# Patient Record
Sex: Female | Born: 1960 | Race: Black or African American | Hispanic: No | Smoking: Former smoker
Health system: Southern US, Community
[De-identification: ages and names within clinical notes are randomized; demographics above are authoritative.]

## PROBLEM LIST (undated history)

## (undated) DIAGNOSIS — R519 Headache, unspecified: Secondary | ICD-10-CM

## (undated) DIAGNOSIS — K219 Gastro-esophageal reflux disease without esophagitis: Secondary | ICD-10-CM

## (undated) DIAGNOSIS — M1990 Unspecified osteoarthritis, unspecified site: Secondary | ICD-10-CM

## (undated) DIAGNOSIS — M549 Dorsalgia, unspecified: Secondary | ICD-10-CM

## (undated) DIAGNOSIS — G5600 Carpal tunnel syndrome, unspecified upper limb: Secondary | ICD-10-CM

## (undated) DIAGNOSIS — Z87442 Personal history of urinary calculi: Secondary | ICD-10-CM

## (undated) DIAGNOSIS — I823 Embolism and thrombosis of renal vein: Principal | ICD-10-CM

## (undated) DIAGNOSIS — J189 Pneumonia, unspecified organism: Secondary | ICD-10-CM

## (undated) DIAGNOSIS — C801 Malignant (primary) neoplasm, unspecified: Secondary | ICD-10-CM

## (undated) DIAGNOSIS — R0600 Dyspnea, unspecified: Secondary | ICD-10-CM

## (undated) DIAGNOSIS — G8929 Other chronic pain: Secondary | ICD-10-CM

## (undated) DIAGNOSIS — E7800 Pure hypercholesterolemia, unspecified: Secondary | ICD-10-CM

## (undated) DIAGNOSIS — I10 Essential (primary) hypertension: Secondary | ICD-10-CM

## (undated) DIAGNOSIS — M79606 Pain in leg, unspecified: Secondary | ICD-10-CM

## (undated) HISTORY — PX: CHOLECYSTECTOMY: SHX55

## (undated) HISTORY — DX: Dorsalgia, unspecified: M54.9

## (undated) HISTORY — PX: OTHER SURGICAL HISTORY: SHX169

## (undated) HISTORY — PX: BACK SURGERY: SHX140

## (undated) HISTORY — PX: APPENDECTOMY: SHX54

## (undated) HISTORY — DX: Embolism and thrombosis of renal vein: I82.3

## (undated) HISTORY — DX: Pain in leg, unspecified: M79.606

## (undated) HISTORY — PX: ABDOMINAL SURGERY: SHX537

## (undated) HISTORY — PX: ABDOMINAL HYSTERECTOMY: SHX81

---

## 2002-01-21 ENCOUNTER — Emergency Department (HOSPITAL_COMMUNITY): Admission: EM | Admit: 2002-01-21 | Discharge: 2002-01-22 | Payer: Self-pay | Admitting: Emergency Medicine

## 2002-02-22 ENCOUNTER — Emergency Department (HOSPITAL_COMMUNITY): Admission: EM | Admit: 2002-02-22 | Discharge: 2002-02-22 | Payer: Self-pay | Admitting: Emergency Medicine

## 2002-03-30 ENCOUNTER — Emergency Department (HOSPITAL_COMMUNITY): Admission: EM | Admit: 2002-03-30 | Discharge: 2002-03-30 | Payer: Self-pay | Admitting: Emergency Medicine

## 2002-03-30 ENCOUNTER — Encounter: Payer: Self-pay | Admitting: Emergency Medicine

## 2002-03-31 ENCOUNTER — Emergency Department (HOSPITAL_COMMUNITY): Admission: EM | Admit: 2002-03-31 | Discharge: 2002-03-31 | Payer: Self-pay | Admitting: Emergency Medicine

## 2002-04-04 ENCOUNTER — Emergency Department (HOSPITAL_COMMUNITY): Admission: EM | Admit: 2002-04-04 | Discharge: 2002-04-04 | Payer: Self-pay | Admitting: Emergency Medicine

## 2002-04-19 ENCOUNTER — Emergency Department (HOSPITAL_COMMUNITY): Admission: EM | Admit: 2002-04-19 | Discharge: 2002-04-19 | Payer: Self-pay | Admitting: Emergency Medicine

## 2002-04-20 ENCOUNTER — Emergency Department (HOSPITAL_COMMUNITY): Admission: EM | Admit: 2002-04-20 | Discharge: 2002-04-20 | Payer: Self-pay | Admitting: Emergency Medicine

## 2002-04-22 ENCOUNTER — Emergency Department (HOSPITAL_COMMUNITY): Admission: EM | Admit: 2002-04-22 | Discharge: 2002-04-22 | Payer: Self-pay | Admitting: *Deleted

## 2002-05-01 ENCOUNTER — Encounter: Payer: Self-pay | Admitting: Emergency Medicine

## 2002-05-01 ENCOUNTER — Emergency Department (HOSPITAL_COMMUNITY): Admission: EM | Admit: 2002-05-01 | Discharge: 2002-05-01 | Payer: Self-pay | Admitting: Emergency Medicine

## 2002-05-10 ENCOUNTER — Encounter: Payer: Self-pay | Admitting: *Deleted

## 2002-05-10 ENCOUNTER — Emergency Department (HOSPITAL_COMMUNITY): Admission: EM | Admit: 2002-05-10 | Discharge: 2002-05-10 | Payer: Self-pay | Admitting: Emergency Medicine

## 2002-05-19 ENCOUNTER — Encounter: Payer: Self-pay | Admitting: Emergency Medicine

## 2002-05-19 ENCOUNTER — Emergency Department (HOSPITAL_COMMUNITY): Admission: EM | Admit: 2002-05-19 | Discharge: 2002-05-19 | Payer: Self-pay | Admitting: Emergency Medicine

## 2002-05-21 ENCOUNTER — Emergency Department (HOSPITAL_COMMUNITY): Admission: EM | Admit: 2002-05-21 | Discharge: 2002-05-21 | Payer: Self-pay | Admitting: *Deleted

## 2002-05-22 ENCOUNTER — Emergency Department (HOSPITAL_COMMUNITY): Admission: EM | Admit: 2002-05-22 | Discharge: 2002-05-22 | Payer: Self-pay | Admitting: Emergency Medicine

## 2002-05-24 ENCOUNTER — Emergency Department (HOSPITAL_COMMUNITY): Admission: EM | Admit: 2002-05-24 | Discharge: 2002-05-24 | Payer: Self-pay | Admitting: Emergency Medicine

## 2002-05-24 ENCOUNTER — Encounter: Payer: Self-pay | Admitting: Emergency Medicine

## 2002-05-28 ENCOUNTER — Encounter: Payer: Self-pay | Admitting: Emergency Medicine

## 2002-05-28 ENCOUNTER — Emergency Department (HOSPITAL_COMMUNITY): Admission: EM | Admit: 2002-05-28 | Discharge: 2002-05-28 | Payer: Self-pay | Admitting: Emergency Medicine

## 2002-05-31 ENCOUNTER — Encounter: Payer: Self-pay | Admitting: Emergency Medicine

## 2002-05-31 ENCOUNTER — Emergency Department (HOSPITAL_COMMUNITY): Admission: EM | Admit: 2002-05-31 | Discharge: 2002-06-01 | Payer: Self-pay | Admitting: Emergency Medicine

## 2002-06-10 ENCOUNTER — Encounter: Payer: Self-pay | Admitting: Emergency Medicine

## 2002-06-10 ENCOUNTER — Emergency Department (HOSPITAL_COMMUNITY): Admission: EM | Admit: 2002-06-10 | Discharge: 2002-06-10 | Payer: Self-pay | Admitting: Emergency Medicine

## 2002-06-22 ENCOUNTER — Encounter: Payer: Self-pay | Admitting: Emergency Medicine

## 2002-06-22 ENCOUNTER — Emergency Department (HOSPITAL_COMMUNITY): Admission: EM | Admit: 2002-06-22 | Discharge: 2002-06-22 | Payer: Self-pay | Admitting: Emergency Medicine

## 2002-06-25 ENCOUNTER — Emergency Department (HOSPITAL_COMMUNITY): Admission: EM | Admit: 2002-06-25 | Discharge: 2002-06-26 | Payer: Self-pay | Admitting: Emergency Medicine

## 2002-07-03 ENCOUNTER — Emergency Department (HOSPITAL_COMMUNITY): Admission: EM | Admit: 2002-07-03 | Discharge: 2002-07-03 | Payer: Self-pay | Admitting: Emergency Medicine

## 2002-07-03 ENCOUNTER — Encounter: Payer: Self-pay | Admitting: Emergency Medicine

## 2002-07-08 ENCOUNTER — Encounter: Payer: Self-pay | Admitting: Emergency Medicine

## 2002-07-08 ENCOUNTER — Emergency Department (HOSPITAL_COMMUNITY): Admission: AD | Admit: 2002-07-08 | Discharge: 2002-07-08 | Payer: Self-pay | Admitting: Emergency Medicine

## 2002-07-14 ENCOUNTER — Emergency Department (HOSPITAL_COMMUNITY): Admission: AD | Admit: 2002-07-14 | Discharge: 2002-07-14 | Payer: Self-pay

## 2002-07-15 ENCOUNTER — Emergency Department (HOSPITAL_COMMUNITY): Admission: EM | Admit: 2002-07-15 | Discharge: 2002-07-15 | Payer: Self-pay | Admitting: Emergency Medicine

## 2002-07-16 ENCOUNTER — Emergency Department (HOSPITAL_COMMUNITY): Admission: EM | Admit: 2002-07-16 | Discharge: 2002-07-16 | Payer: Self-pay | Admitting: Emergency Medicine

## 2002-07-17 ENCOUNTER — Emergency Department (HOSPITAL_COMMUNITY): Admission: AD | Admit: 2002-07-17 | Discharge: 2002-07-17 | Payer: Self-pay | Admitting: Emergency Medicine

## 2002-07-18 ENCOUNTER — Emergency Department (HOSPITAL_COMMUNITY): Admission: EM | Admit: 2002-07-18 | Discharge: 2002-07-18 | Payer: Self-pay | Admitting: *Deleted

## 2002-07-30 ENCOUNTER — Emergency Department (HOSPITAL_COMMUNITY): Admission: EM | Admit: 2002-07-30 | Discharge: 2002-07-31 | Payer: Self-pay | Admitting: Emergency Medicine

## 2002-07-31 ENCOUNTER — Encounter: Payer: Self-pay | Admitting: Emergency Medicine

## 2002-08-05 ENCOUNTER — Encounter: Payer: Self-pay | Admitting: Emergency Medicine

## 2002-08-05 ENCOUNTER — Emergency Department (HOSPITAL_COMMUNITY): Admission: EM | Admit: 2002-08-05 | Discharge: 2002-08-06 | Payer: Self-pay | Admitting: Emergency Medicine

## 2002-08-09 ENCOUNTER — Emergency Department (HOSPITAL_COMMUNITY): Admission: EM | Admit: 2002-08-09 | Discharge: 2002-08-09 | Payer: Self-pay | Admitting: Emergency Medicine

## 2002-08-14 ENCOUNTER — Emergency Department (HOSPITAL_COMMUNITY): Admission: EM | Admit: 2002-08-14 | Discharge: 2002-08-14 | Payer: Self-pay | Admitting: Emergency Medicine

## 2002-08-21 ENCOUNTER — Emergency Department (HOSPITAL_COMMUNITY): Admission: EM | Admit: 2002-08-21 | Discharge: 2002-08-21 | Payer: Self-pay | Admitting: Internal Medicine

## 2002-08-26 ENCOUNTER — Emergency Department (HOSPITAL_COMMUNITY): Admission: EM | Admit: 2002-08-26 | Discharge: 2002-08-27 | Payer: Self-pay

## 2002-09-04 ENCOUNTER — Encounter: Payer: Self-pay | Admitting: Emergency Medicine

## 2002-09-04 ENCOUNTER — Emergency Department (HOSPITAL_COMMUNITY): Admission: EM | Admit: 2002-09-04 | Discharge: 2002-09-05 | Payer: Self-pay | Admitting: Emergency Medicine

## 2003-06-04 ENCOUNTER — Ambulatory Visit (HOSPITAL_COMMUNITY): Admission: RE | Admit: 2003-06-04 | Discharge: 2003-06-04 | Payer: Self-pay | Admitting: General Surgery

## 2003-09-19 ENCOUNTER — Emergency Department (HOSPITAL_COMMUNITY): Admission: EM | Admit: 2003-09-19 | Discharge: 2003-09-19 | Payer: Self-pay | Admitting: Emergency Medicine

## 2003-11-17 ENCOUNTER — Emergency Department (HOSPITAL_COMMUNITY): Admission: EM | Admit: 2003-11-17 | Discharge: 2003-11-17 | Payer: Self-pay | Admitting: Emergency Medicine

## 2003-12-22 ENCOUNTER — Emergency Department (HOSPITAL_COMMUNITY): Admission: EM | Admit: 2003-12-22 | Discharge: 2003-12-22 | Payer: Self-pay | Admitting: Emergency Medicine

## 2004-01-26 ENCOUNTER — Emergency Department (HOSPITAL_COMMUNITY): Admission: EM | Admit: 2004-01-26 | Discharge: 2004-01-26 | Payer: Self-pay | Admitting: Emergency Medicine

## 2004-01-28 ENCOUNTER — Emergency Department (HOSPITAL_COMMUNITY): Admission: EM | Admit: 2004-01-28 | Discharge: 2004-01-28 | Payer: Self-pay | Admitting: Emergency Medicine

## 2004-03-16 ENCOUNTER — Emergency Department (HOSPITAL_COMMUNITY): Admission: EM | Admit: 2004-03-16 | Discharge: 2004-03-16 | Payer: Self-pay | Admitting: Emergency Medicine

## 2011-07-21 ENCOUNTER — Emergency Department (HOSPITAL_COMMUNITY)
Admission: EM | Admit: 2011-07-21 | Discharge: 2011-07-21 | Disposition: A | Discharge status: Home / Self Care | Payer: Medicare Other | Attending: Emergency Medicine | Admitting: Emergency Medicine

## 2011-07-21 ENCOUNTER — Encounter (HOSPITAL_COMMUNITY): Payer: Self-pay | Admitting: *Deleted

## 2011-07-21 DIAGNOSIS — M79609 Pain in unspecified limb: Secondary | ICD-10-CM | POA: Insufficient documentation

## 2011-07-21 DIAGNOSIS — Z79899 Other long term (current) drug therapy: Secondary | ICD-10-CM | POA: Insufficient documentation

## 2011-07-21 DIAGNOSIS — Z859 Personal history of malignant neoplasm, unspecified: Secondary | ICD-10-CM | POA: Insufficient documentation

## 2011-07-21 DIAGNOSIS — I10 Essential (primary) hypertension: Secondary | ICD-10-CM | POA: Insufficient documentation

## 2011-07-21 DIAGNOSIS — E7800 Pure hypercholesterolemia, unspecified: Secondary | ICD-10-CM | POA: Insufficient documentation

## 2011-07-21 DIAGNOSIS — Y92009 Unspecified place in unspecified non-institutional (private) residence as the place of occurrence of the external cause: Secondary | ICD-10-CM | POA: Insufficient documentation

## 2011-07-21 DIAGNOSIS — IMO0002 Reserved for concepts with insufficient information to code with codable children: Secondary | ICD-10-CM | POA: Insufficient documentation

## 2011-07-21 HISTORY — DX: Malignant (primary) neoplasm, unspecified: C80.1

## 2011-07-21 HISTORY — DX: Essential (primary) hypertension: I10

## 2011-07-21 HISTORY — DX: Pure hypercholesterolemia, unspecified: E78.00

## 2011-07-21 MED ORDER — SILVER SULFADIAZINE 1 % EX CREA
TOPICAL_CREAM | Freq: Once | CUTANEOUS | Status: AC
  Administered 2011-07-21: 21:00:00 via TOPICAL
  Filled 2011-07-21: qty 50

## 2011-07-21 MED ORDER — HYDROCODONE-ACETAMINOPHEN 5-325 MG PO TABS
1.0000 | ORAL_TABLET | Freq: Once | ORAL | Status: AC
  Administered 2011-07-21: 1 via ORAL
  Filled 2011-07-21: qty 1

## 2011-07-21 MED ORDER — HYDROCODONE-ACETAMINOPHEN 5-325 MG PO TABS: 1.0000 | ORAL_TABLET | Freq: Four times a day (QID) | ORAL | Status: DC | PRN

## 2011-07-21 MED ORDER — HYDROCODONE-ACETAMINOPHEN 5-325 MG PO TABS
1.0000 | ORAL_TABLET | Freq: Once | ORAL | Status: AC
  Administered 2011-07-21: 1 via ORAL

## 2011-07-21 NOTE — ED Provider Notes (Signed)
Medical screening examination/treatment/procedure(s) were performed by non-physician practitioner and as supervising physician I was immediately available for consultation/collaboration.   Benny Lennert, MD 07/21/11 2253
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
## 2011-07-21 NOTE — ED Notes (Signed)
Patient with no complaints at this time. Respirations even and unlabored. Skin warm/dry. Discharge instructions reviewed with patient at this time. Patient given opportunity to voice concerns/ask questions. Patient discharged at this time and left Emergency Department with steady gait.   
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
## 2011-07-29 ENCOUNTER — Emergency Department (HOSPITAL_COMMUNITY)
Admission: EM | Admit: 2011-07-29 | Discharge: 2011-07-29 | Disposition: A | Discharge status: Home / Self Care | Payer: Medicare Other | Attending: Emergency Medicine | Admitting: Emergency Medicine

## 2011-07-29 ENCOUNTER — Encounter (HOSPITAL_COMMUNITY): Payer: Self-pay | Admitting: *Deleted

## 2011-07-29 DIAGNOSIS — M549 Dorsalgia, unspecified: Secondary | ICD-10-CM

## 2011-07-29 DIAGNOSIS — T23011A Burn of unspecified degree of right thumb (nail), initial encounter: Secondary | ICD-10-CM

## 2011-07-29 DIAGNOSIS — I10 Essential (primary) hypertension: Secondary | ICD-10-CM | POA: Insufficient documentation

## 2011-07-29 DIAGNOSIS — M5430 Sciatica, unspecified side: Secondary | ICD-10-CM | POA: Insufficient documentation

## 2011-07-29 DIAGNOSIS — E7800 Pure hypercholesterolemia, unspecified: Secondary | ICD-10-CM | POA: Insufficient documentation

## 2011-07-29 DIAGNOSIS — M79609 Pain in unspecified limb: Secondary | ICD-10-CM | POA: Insufficient documentation

## 2011-07-29 MED ORDER — NAPROXEN 500 MG PO TABS: 500.0000 mg | ORAL_TABLET | Freq: Two times a day (BID) | ORAL | Status: AC

## 2011-07-29 MED ORDER — ORPHENADRINE CITRATE ER 100 MG PO TB12: 100.0000 mg | ORAL_TABLET | Freq: Two times a day (BID) | ORAL | Status: AC

## 2011-07-29 MED ORDER — HYDROCODONE-ACETAMINOPHEN 10-325 MG PO TABS: 1.0000 | ORAL_TABLET | Freq: Four times a day (QID) | ORAL | Status: AC | PRN
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
## 2011-10-07 ENCOUNTER — Emergency Department (HOSPITAL_COMMUNITY)
Admission: EM | Admit: 2011-10-07 | Discharge: 2011-10-07 | Disposition: A | Discharge status: Home / Self Care | Payer: Medicare Other | Attending: Emergency Medicine | Admitting: Emergency Medicine

## 2011-10-07 ENCOUNTER — Encounter (HOSPITAL_COMMUNITY): Payer: Self-pay | Admitting: Emergency Medicine

## 2011-10-07 DIAGNOSIS — M549 Dorsalgia, unspecified: Secondary | ICD-10-CM

## 2011-10-07 DIAGNOSIS — I10 Essential (primary) hypertension: Secondary | ICD-10-CM | POA: Insufficient documentation

## 2011-10-07 DIAGNOSIS — Z859 Personal history of malignant neoplasm, unspecified: Secondary | ICD-10-CM | POA: Insufficient documentation

## 2011-10-07 DIAGNOSIS — E7800 Pure hypercholesterolemia, unspecified: Secondary | ICD-10-CM | POA: Insufficient documentation

## 2011-10-07 DIAGNOSIS — M545 Low back pain, unspecified: Secondary | ICD-10-CM | POA: Insufficient documentation

## 2011-10-07 MED ORDER — HYDROCODONE-ACETAMINOPHEN 5-325 MG PO TABS: 1.0000 | ORAL_TABLET | ORAL | Status: AC | PRN

## 2011-10-07 NOTE — ED Provider Notes (Signed)
Medical screening examination/treatment/procedure(s) were performed by non-physician practitioner and as supervising physician I was immediately available for consultation/collaboration.   Joya Gaskins, MD 10/07/11 1534

## 2011-10-07 NOTE — ED Notes (Signed)
Patient with no complaints at this time. Respirations even and unlabored. Skin warm/dry. Discharge instructions reviewed with patient at this time. Patient given opportunity to voice concerns/ask questions. IV removed per policy and band-aid applied to sit. Patient discharged at this time and left Emergency Department with steady gait.   

## 2011-10-07 NOTE — ED Notes (Signed)
Chronic lower back pain and sciatica. Pt c/o lower back pain on right side x 1 week. Denies urinary problems/changes. Radiates down both legs at this time. Has been taking tylenol without relief.

## 2011-10-07 NOTE — ED Provider Notes (Signed)
History     CSN: [id number]  Arrival date & time 10/07/11  1230   First MD Initiated Contact with Patient 10/07/11 1251      Chief Complaint  Patient presents with  . Back Pain    HPI Katrina Vasquez is a 51 y.o. female who presents to the ED with back pain. The pain started about a week ago. The pain is located in the lower back on the right side and goes down the right leg. She rates the pain as 8/10. Associated symptoms include headache. Hx of chronic back pain but usually not this bad. Has had back surgery. She denies loss of control of bladder or bowels. She has taken ibuprofen and tylenol without relief. The pain started after helping move and take care of sick mother. The history was provided by the patient.  Past Medical History  Diagnosis Date  . Hypertension   . Hypercholesteremia   . Cancer     Past Surgical History  Procedure Date  . Abdominal surgery   . Abdominal hysterectomy     History reviewed. No pertinent family history.  History  Substance Use Topics  . Smoking status: Never Smoker   . Smokeless tobacco: Not on file  . Alcohol Use: No    OB History    Grav Para Term Preterm Abortions TAB SAB Ect Mult Living                  Review of Systems  Constitutional: Negative for fever, chills, diaphoresis and fatigue.  HENT: Negative for ear pain, congestion, sore throat, facial swelling, neck pain, neck stiffness, dental problem and sinus pressure.   Eyes: Negative for photophobia, pain and discharge.  Respiratory: Negative for cough, chest tightness and wheezing.   Cardiovascular: Negative for chest pain and leg swelling.  Gastrointestinal: Negative for nausea, vomiting, abdominal pain, diarrhea, constipation and abdominal distention.  Genitourinary: Negative for dysuria, frequency, flank pain and difficulty urinating.  Musculoskeletal: Positive for back pain. Negative for myalgias and gait problem.  Skin: Negative for color change and rash.    Neurological: Positive for headaches. Negative for dizziness, speech difficulty, weakness, light-headedness and numbness.  Psychiatric/Behavioral: Negative for confusion and agitation. The patient is not nervous/anxious.        Taking medication for depression    Allergies  Percocet; Tramadol; and Vistaril  Home Medications   Current Outpatient Rx  Name Route Sig Dispense Refill  . BACLOFEN 10 MG PO TABS Oral Take 10 mg by mouth 2 (two) times daily.    . CEPHALEXIN 500 MG PO CAPS Oral Take 500 mg by mouth every 6 (six) hours.    . ESTRADIOL 0.5 MG PO TABS Oral Take 1 mg by mouth daily.    Marland Kitchen GABAPENTIN 300 MG PO CAPS Oral Take 300 mg by mouth 3 (three) times daily.    Marland Kitchen LISINOPRIL 20 MG PO TABS Oral Take 20 mg by mouth daily.    Marland Kitchen NAPROXEN 500 MG PO TABS Oral Take 1 tablet (500 mg total) by mouth 2 (two) times daily. 30 tablet 0  . OMEPRAZOLE 20 MG PO CPDR Oral Take 20 mg by mouth 2 (two) times daily.     Marland Kitchen PROMETHAZINE HCL 25 MG PO TABS Oral Take 25 mg by mouth 2 (two) times daily.    . QUETIAPINE FUMARATE 100 MG PO TABS Oral Take 100 mg by mouth at bedtime.      BP 139/92  Pulse 81  Temp 98.5 F (  36.9 C) (Oral)  Resp 20  Ht 5\' 3"  (1.6 m)  Wt 120 lb (54.432 kg)  BMI 21.26 kg/m2  SpO2 100%  Physical Exam  Nursing note and vitals reviewed. Constitutional: She is oriented to person, place, and time. She appears well-developed and well-nourished. No distress.  HENT:  Head: Normocephalic and atraumatic.  Eyes: EOM are normal. Pupils are equal, round, and reactive to light.  Neck: Neck supple.  Cardiovascular: Normal rate and regular rhythm.   Pulmonary/Chest: Effort normal. No respiratory distress. She has no wheezes.  Abdominal: Soft. There is no tenderness.  Musculoskeletal: Normal range of motion. She exhibits no edema.       Pain in right lower lumbar area with range of motion and palpation. Pedal pulses present and equal bilateral. Ambulatory with steady gait.    Neurological: She is alert and oriented to person, place, and time. She has normal strength and normal reflexes. No cranial nerve deficit or sensory deficit.       Straight leg raises without difficulty.  Skin: Skin is warm and dry.  Psychiatric: She has a normal mood and affect. Her behavior is normal. Judgment and thought content normal.   Assessment: 51 y.o. female with low back pain  Plan:  Pain management   Follow up with neuro  Discussed with the patient and all questioned fully answered. She will follow up with with neuro or return here if any problems arise. Follow-up Information    Schedule an appointment as soon as possible for a visit with [facility], Darleen Crocker, MD.   Contact information:   [address] [place] [address] [phone]              Medication List     As of 10/07/2011  2:06 PM    START taking these medications         HYDROcodone-acetaminophen 5-325 MG per tablet   Commonly known as: NORCO/VICODIN   Take 1 tablet by mouth every 4 (four) hours as needed for pain.      ASK your doctor about these medications         baclofen 10 MG tablet   Commonly known as: LIORESAL      cephALEXin 500 MG capsule   Commonly known as: KEFLEX      estradiol 0.5 MG tablet   Commonly known as: ESTRACE      gabapentin 300 MG capsule   Commonly known as: NEURONTIN      lisinopril 20 MG tablet   Commonly known as: PRINIVIL,ZESTRIL      naproxen 500 MG tablet   Commonly known as: NAPROSYN   Take 1 tablet (500 mg total) by mouth 2 (two) times daily.      omeprazole 20 MG capsule   Commonly known as: PRILOSEC      promethazine 25 MG tablet   Commonly known as: PHENERGAN      QUEtiapine 100 MG tablet   Commonly known as: SEROQUEL          Where to get your medications    These are the prescriptions that you need to pick up.   You may get these medications from any pharmacy.         HYDROcodone-acetaminophen 5-325 MG per tablet            ED Course  Procedures        [facility], NP 10/07/11 1407

## 2011-10-17 ENCOUNTER — Other Ambulatory Visit: Payer: Self-pay | Admitting: Obstetrics & Gynecology

## 2011-10-17 DIAGNOSIS — Z1231 Encounter for screening mammogram for malignant neoplasm of breast: Secondary | ICD-10-CM

## 2011-10-27 ENCOUNTER — Ambulatory Visit (HOSPITAL_COMMUNITY): Payer: Medicare Other

## 2011-11-02 ENCOUNTER — Ambulatory Visit (HOSPITAL_COMMUNITY)
Admission: RE | Admit: 2011-11-02 | Discharge: 2011-11-02 | Disposition: A | Discharge status: Home / Self Care | Payer: Medicare Other | Source: Ambulatory Visit | Attending: Obstetrics & Gynecology | Admitting: Obstetrics & Gynecology

## 2011-11-02 DIAGNOSIS — Z1231 Encounter for screening mammogram for malignant neoplasm of breast: Secondary | ICD-10-CM | POA: Insufficient documentation

## 2011-11-07 ENCOUNTER — Other Ambulatory Visit: Payer: Self-pay | Admitting: Obstetrics & Gynecology

## 2011-11-07 DIAGNOSIS — R928 Other abnormal and inconclusive findings on diagnostic imaging of breast: Secondary | ICD-10-CM

## 2011-11-10 ENCOUNTER — Ambulatory Visit
Admission: RE | Admit: 2011-11-10 | Discharge: 2011-11-10 | Disposition: A | Discharge status: Home / Self Care | Payer: Medicare Other | Source: Ambulatory Visit | Attending: Obstetrics & Gynecology | Admitting: Obstetrics & Gynecology

## 2011-11-10 ENCOUNTER — Other Ambulatory Visit: Payer: Self-pay | Admitting: Obstetrics & Gynecology

## 2011-11-10 DIAGNOSIS — R928 Other abnormal and inconclusive findings on diagnostic imaging of breast: Secondary | ICD-10-CM

## 2012-10-25 ENCOUNTER — Emergency Department (HOSPITAL_COMMUNITY): Payer: Medicare Other

## 2012-10-25 ENCOUNTER — Emergency Department (HOSPITAL_COMMUNITY)
Admission: EM | Admit: 2012-10-25 | Discharge: 2012-10-26 | Disposition: A | Discharge status: Home / Self Care | Payer: Medicare Other | Attending: Emergency Medicine | Admitting: Emergency Medicine

## 2012-10-25 ENCOUNTER — Encounter (HOSPITAL_COMMUNITY): Payer: Self-pay | Admitting: *Deleted

## 2012-10-25 DIAGNOSIS — R112 Nausea with vomiting, unspecified: Secondary | ICD-10-CM | POA: Insufficient documentation

## 2012-10-25 DIAGNOSIS — G8929 Other chronic pain: Secondary | ICD-10-CM | POA: Insufficient documentation

## 2012-10-25 DIAGNOSIS — R109 Unspecified abdominal pain: Secondary | ICD-10-CM | POA: Insufficient documentation

## 2012-10-25 DIAGNOSIS — Z859 Personal history of malignant neoplasm, unspecified: Secondary | ICD-10-CM | POA: Insufficient documentation

## 2012-10-25 DIAGNOSIS — Z8639 Personal history of other endocrine, nutritional and metabolic disease: Secondary | ICD-10-CM | POA: Insufficient documentation

## 2012-10-25 DIAGNOSIS — Z79899 Other long term (current) drug therapy: Secondary | ICD-10-CM | POA: Insufficient documentation

## 2012-10-25 DIAGNOSIS — I10 Essential (primary) hypertension: Secondary | ICD-10-CM | POA: Insufficient documentation

## 2012-10-25 DIAGNOSIS — Z862 Personal history of diseases of the blood and blood-forming organs and certain disorders involving the immune mechanism: Secondary | ICD-10-CM | POA: Insufficient documentation

## 2012-10-25 LAB — URINALYSIS, ROUTINE W REFLEX MICROSCOPIC
Ketones, ur: 15 mg/dL — AB
Nitrite: NEGATIVE
Protein, ur: NEGATIVE mg/dL
Specific Gravity, Urine: 1.031 — ABNORMAL HIGH (ref 1.005–1.030)
Urobilinogen, UA: 0.2 mg/dL (ref 0.0–1.0)
pH: 5.5 (ref 5.0–8.0)

## 2012-10-25 LAB — CBC WITH DIFFERENTIAL/PLATELET
Basophils Absolute: 0 10*3/uL (ref 0.0–0.1)
Basophils Relative: 0 % (ref 0–1)
Eosinophils Relative: 1 % (ref 0–5)
HCT: 38.8 % (ref 36.0–46.0)
MCH: 31.6 pg (ref 26.0–34.0)
MCHC: 34.5 g/dL (ref 30.0–36.0)
MCV: 91.5 fL (ref 78.0–100.0)
Monocytes Absolute: 0.6 10*3/uL (ref 0.1–1.0)
Neutro Abs: 4.2 10*3/uL (ref 1.7–7.7)
Platelets: 251 10*3/uL (ref 150–400)
RBC: 4.24 MIL/uL (ref 3.87–5.11)
RDW: 12.8 % (ref 11.5–15.5)

## 2012-10-25 LAB — TROPONIN I: Troponin I: <0.3 ng/mL (ref <0.30)

## 2012-10-25 LAB — HEPATIC FUNCTION PANEL
AST: 23 U/L (ref 0–37)
Bilirubin, Direct: <0.1 mg/dL (ref 0.0–0.3)
Total Bilirubin: 0.3 mg/dL (ref 0.3–1.2)
Total Protein: 7.8 g/dL (ref 6.0–8.3)

## 2012-10-25 LAB — BASIC METABOLIC PANEL
BUN: 14 mg/dL (ref 6–23)
Calcium: 10 mg/dL (ref 8.4–10.5)
Chloride: 99 mEq/L (ref 96–112)
Creatinine, Ser: 0.83 mg/dL (ref 0.50–1.10)
GFR calc Af Amer: >90 mL/min (ref >90)
GFR calc non Af Amer: 80 mL/min — ABNORMAL LOW (ref >90)

## 2012-10-25 LAB — LIPASE, BLOOD: Lipase: 15 U/L (ref 11–59)

## 2012-10-25 MED ORDER — SODIUM CHLORIDE 0.9 % IV BOLUS (SEPSIS)
1000.0000 mL | Freq: Once | INTRAVENOUS | Status: AC
  Administered 2012-10-25: 1000 mL via INTRAVENOUS

## 2012-10-25 MED ORDER — ONDANSETRON HCL 8 MG PO TABS: 8.0000 mg | ORAL_TABLET | Freq: Three times a day (TID) | ORAL | Status: DC | PRN

## 2012-10-25 MED ORDER — ONDANSETRON 4 MG PO TBDP
8.0000 mg | ORAL_TABLET | Freq: Once | ORAL | Status: AC
  Administered 2012-10-25: 8 mg via ORAL
  Filled 2012-10-25: qty 2

## 2012-10-25 NOTE — ED Notes (Signed)
nv for 3-4 days no diarrhea.  lmp none hyst

## 2012-10-25 NOTE — ED Notes (Signed)
Patient transported to X-ray 

## 2012-10-25 NOTE — ED Notes (Signed)
Lab called; blood tube clotted.  Redrawn, sent via tube system to lab. Pt tolerated well.

## 2012-10-25 NOTE — ED Provider Notes (Signed)
CSN: [id number]     Arrival date & time 10/25/12  1854 History   First MD Initiated Contact with Patient 10/25/12 1925     Chief Complaint  Patient presents with  . Emesis   (Consider location/radiation/quality/duration/timing/severity/associated sxs/prior Treatment) HPI Complains of vomiting 3 or 4 times daily for the past 3 days. She is presently hungry. Denies nausea since treatment with Zofran prior to my exam in the emergency department. He complains of diffuse abdominal pain which is mild and chronic and unchanged from baseline. Last bowel movement 2 days ago. No urinary symptoms. No fever. No other complaint. Nothing makes symptoms better or worse. No other associated symptoms. Past Medical History  Diagnosis Date  . Hypertension   . Hypercholesteremia   . Cancer    Past Surgical History  Procedure Laterality Date  . Abdominal surgery    . Abdominal hysterectomy     No family history on file. History  Substance Use Topics  . Smoking status: Never Smoker   . Smokeless tobacco: Not on file  . Alcohol Use: No   OB History   Grav Para Term Preterm Abortions TAB SAB Ect Mult Living                 Review of Systems  Constitutional: Negative.   HENT: Negative.   Respiratory: Negative.   Cardiovascular: Negative.   Gastrointestinal: Positive for nausea, vomiting and abdominal pain.       Chronic abdominal pain. [facility] pain clinic.  Musculoskeletal: Negative.   Skin: Negative.   Neurological: Negative.   Psychiatric/Behavioral: Negative.   All other systems reviewed and are negative.    Allergies  Percocet; Tramadol; and Vistaril  Home Medications   Current Outpatient Rx  Name  Route  Sig  Dispense  Refill  . baclofen (LIORESAL) 10 MG tablet   Oral   Take 10 mg by mouth 2 (two) times daily.         . cephALEXin (KEFLEX) 500 MG capsule   Oral   Take 500 mg by mouth every 6 (six) hours.         Marland Kitchen estradiol (ESTRACE) 0.5 MG tablet   Oral   Take 1 mg  by mouth daily.         Marland Kitchen gabapentin (NEURONTIN) 300 MG capsule   Oral   Take 300 mg by mouth 3 (three) times daily.         Marland Kitchen lisinopril (PRINIVIL,ZESTRIL) 20 MG tablet   Oral   Take 20 mg by mouth daily.         Marland Kitchen omeprazole (PRILOSEC) 20 MG capsule   Oral   Take 20 mg by mouth 2 (two) times daily.          . promethazine (PHENERGAN) 25 MG tablet   Oral   Take 25 mg by mouth 2 (two) times daily.         . QUEtiapine (SEROQUEL) 100 MG tablet   Oral   Take 100 mg by mouth at bedtime.          BP 129/89  Pulse 114  Temp(Src) 99.8 F (37.7 C) (Oral)  Resp 18  Ht 5\' 4"  (1.626 m)  Wt 132 lb (59.875 kg)  BMI 22.65 kg/m2  SpO2 98% Physical Exam  Nursing note and vitals reviewed. Constitutional: She is oriented to person, place, and time. She appears well-developed and well-nourished.  HENT:  Head: Normocephalic and atraumatic.  Eyes: Conjunctivae are normal. Pupils are equal, round,  and reactive to light.  Neck: Neck supple. No tracheal deviation present. No thyromegaly present.  Cardiovascular: Normal rate and regular rhythm.   No murmur heard. Pulmonary/Chest: Effort normal and breath sounds normal.  Abdominal: Soft. Bowel sounds are normal. She exhibits no distension and no mass. There is tenderness. There is no rebound and no guarding.  Mild diffuse tenderness midline surgical scar  Musculoskeletal: Normal range of motion. She exhibits no edema and no tenderness.  Neurological: She is alert and oriented to person, place, and time. Coordination normal.  Skin: Skin is warm and dry. No rash noted.  Psychiatric: She has a normal mood and affect.    ED Course  Procedures (including critical care time) Labs Review Labs Reviewed  CBC WITH DIFFERENTIAL  BASIC METABOLIC PANEL  URINALYSIS, ROUTINE W REFLEX MICROSCOPIC   Imaging Review No results found.  Date: 10/25/2012  Rate: 90  Rhythm: normal sinus rhythm  QRS Axis: normal  Intervals: normal  ST/T  Wave abnormalities: nonspecific T wave changes  Conduction Disutrbances:none  Narrative Interpretation:   Old EKG Reviewed: Unchanged from 08/09/2002 interpreted by me  11:15 PM patient was in a drink water. She no longer feels nauseated. Abdominal pain is well controlled. X-rays viewed by me Results for orders placed during the hospital encounter of 10/25/12  BASIC METABOLIC PANEL      Result Value Range   Sodium 139  135 - 145 mEq/L   Potassium 3.9  3.5 - 5.1 mEq/L   Chloride 99  96 - 112 mEq/L   CO2 19  19 - 32 mEq/L   Glucose, Bld 106 (*) 70 - 99 mg/dL   BUN 14  6 - 23 mg/dL   Creatinine, Ser 1.61  0.50 - 1.10 mg/dL   Calcium 09.6  8.4 - 04.5 mg/dL   GFR calc non Af Amer 80 (*) >90 mL/min   GFR calc Af Amer >90  >90 mL/min  URINALYSIS, ROUTINE W REFLEX MICROSCOPIC      Result Value Range   Color, Urine YELLOW  YELLOW   APPearance HAZY (*) CLEAR   Specific Gravity, Urine 1.031 (*) 1.005 - 1.030   pH 5.5  5.0 - 8.0   Glucose, UA NEGATIVE  NEGATIVE mg/dL   Hgb urine dipstick NEGATIVE  NEGATIVE   Bilirubin Urine SMALL (*) NEGATIVE   Ketones, ur 15 (*) NEGATIVE mg/dL   Protein, ur NEGATIVE  NEGATIVE mg/dL   Urobilinogen, UA 0.2  0.0 - 1.0 mg/dL   Nitrite NEGATIVE  NEGATIVE   Leukocytes, UA NEGATIVE  NEGATIVE  HEPATIC FUNCTION PANEL      Result Value Range   Total Protein 7.8  6.0 - 8.3 g/dL   Albumin 4.1  3.5 - 5.2 g/dL   AST 23  0 - 37 U/L   ALT 14  0 - 35 U/L   Alkaline Phosphatase 64  39 - 117 U/L   Total Bilirubin 0.3  0.3 - 1.2 mg/dL   Bilirubin, Direct <4.0  0.0 - 0.3 mg/dL   Indirect Bilirubin NOT CALCULATED  0.3 - 0.9 mg/dL  LIPASE, BLOOD      Result Value Range   Lipase 15  11 - 59 U/L  TROPONIN I      Result Value Range   Troponin I <0.30  <0.30 ng/mL  CBC WITH DIFFERENTIAL      Result Value Range   WBC 7.7  4.0 - 10.5 K/uL   RBC 4.24  3.87 - 5.11 MIL/uL   Hemoglobin  13.4  12.0 - 15.0 g/dL   HCT 84.6  96.2 - 95.2 %   MCV 91.5  78.0 - 100.0 fL   MCH  31.6  26.0 - 34.0 pg   MCHC 34.5  30.0 - 36.0 g/dL   RDW 84.1  32.4 - 40.1 %   Platelets 251  150 - 400 K/uL   Neutrophils Relative % 55  43 - 77 %   Neutro Abs 4.2  1.7 - 7.7 K/uL   Lymphocytes Relative 36  12 - 46 %   Lymphs Abs 2.8  0.7 - 4.0 K/uL   Monocytes Relative 7  3 - 12 %   Monocytes Absolute 0.6  0.1 - 1.0 K/uL   Eosinophils Relative 1  0 - 5 %   Eosinophils Absolute 0.1  0.0 - 0.7 K/uL   Basophils Relative 0  0 - 1 %   Basophils Absolute 0.0  0.0 - 0.1 K/uL  POTASSIUM      Result Value Range   Potassium 3.7  3.5 - 5.1 mEq/L   Dg Abd Acute W/chest  10/25/2012   CLINICAL DATA:  Abdominal pain. Nausea and vomiting.  EXAM: ACUTE ABDOMEN SERIES (ABDOMEN 2 VIEW & CHEST 1 VIEW)  COMPARISON:  12/22/2003. No more recent similar comparison exam.  FINDINGS: There is no evidence of dilated bowel loops or free intraperitoneal air. No radiopaque calculi or other significant radiographic abnormality is seen. Heart size and mediastinal contours are within normal limits. Both lungs are clear. Cervical fusion hardware noted. Clips are noted over the abdomen. Rightward curvature of the lumbar spine is noted centered at L2.  IMPRESSION: Negative abdominal radiographs.  No acute cardiopulmonary disease.   Electronically Signed   By: Christiana Pellant M.D.   On: 10/25/2012 21:02    MDM  No diagnosis found. Patient exhibits no evidence of bowel obstruction on abdominal x-rays. She does suffer from chronic abdominal pain Plan prescription for Zofran. Followup with Dr . Concepcion Elk as needed  Diagnosis #1 nausea and vomiting #2 chronic abdominal pain    Doug Sou, MD 10/25/12 2326

## 2012-10-25 NOTE — ED Notes (Signed)
Pt reports she feels much improved.  K+ from lab completed, verified on tube that was not clotted.  Dr Shela Commons aware.

## 2012-11-19 ENCOUNTER — Encounter (HOSPITAL_COMMUNITY): Payer: Self-pay | Admitting: Emergency Medicine

## 2012-11-19 ENCOUNTER — Emergency Department (HOSPITAL_COMMUNITY)
Admission: EM | Admit: 2012-11-19 | Discharge: 2012-11-19 | Disposition: A | Discharge status: Home / Self Care | Payer: Medicare Other | Attending: Emergency Medicine | Admitting: Emergency Medicine

## 2012-11-19 DIAGNOSIS — E7800 Pure hypercholesterolemia, unspecified: Secondary | ICD-10-CM | POA: Insufficient documentation

## 2012-11-19 DIAGNOSIS — Z859 Personal history of malignant neoplasm, unspecified: Secondary | ICD-10-CM | POA: Insufficient documentation

## 2012-11-19 DIAGNOSIS — Z79899 Other long term (current) drug therapy: Secondary | ICD-10-CM | POA: Insufficient documentation

## 2012-11-19 DIAGNOSIS — R6883 Chills (without fever): Secondary | ICD-10-CM | POA: Insufficient documentation

## 2012-11-19 DIAGNOSIS — G8929 Other chronic pain: Secondary | ICD-10-CM | POA: Insufficient documentation

## 2012-11-19 DIAGNOSIS — R112 Nausea with vomiting, unspecified: Secondary | ICD-10-CM | POA: Insufficient documentation

## 2012-11-19 DIAGNOSIS — R51 Headache: Secondary | ICD-10-CM | POA: Insufficient documentation

## 2012-11-19 DIAGNOSIS — I10 Essential (primary) hypertension: Secondary | ICD-10-CM | POA: Insufficient documentation

## 2012-11-19 LAB — COMPREHENSIVE METABOLIC PANEL
ALT: 11 U/L (ref 0–35)
AST: 14 U/L (ref 0–37)
Albumin: 4.2 g/dL (ref 3.5–5.2)
Alkaline Phosphatase: 65 U/L (ref 39–117)
BUN: 18 mg/dL (ref 6–23)
CO2: 28 mEq/L (ref 19–32)
Chloride: 96 mEq/L (ref 96–112)
GFR calc Af Amer: >90 mL/min (ref >90)
Potassium: 2.9 mEq/L — ABNORMAL LOW (ref 3.5–5.1)
Sodium: 138 mEq/L (ref 135–145)
Total Bilirubin: 0.3 mg/dL (ref 0.3–1.2)
Total Protein: 7.7 g/dL (ref 6.0–8.3)

## 2012-11-19 LAB — CBC WITH DIFFERENTIAL/PLATELET
Basophils Absolute: 0 10*3/uL (ref 0.0–0.1)
Basophils Relative: 0 % (ref 0–1)
Eosinophils Absolute: 0 10*3/uL (ref 0.0–0.7)
Lymphocytes Relative: 28 % (ref 12–46)
MCH: 31.4 pg (ref 26.0–34.0)
MCHC: 33.6 g/dL (ref 30.0–36.0)
Neutrophils Relative %: 66 % (ref 43–77)
Platelets: 233 10*3/uL (ref 150–400)
RDW: 12.7 % (ref 11.5–15.5)

## 2012-11-19 MED ORDER — ONDANSETRON HCL 4 MG/2ML IJ SOLN
4.0000 mg | Freq: Once | INTRAMUSCULAR | Status: AC
  Administered 2012-11-19: 4 mg via INTRAVENOUS
  Filled 2012-11-19: qty 2

## 2012-11-19 MED ORDER — PANTOPRAZOLE SODIUM 40 MG IV SOLR
40.0000 mg | Freq: Once | INTRAVENOUS | Status: AC
  Administered 2012-11-19: 40 mg via INTRAVENOUS
  Filled 2012-11-19: qty 40

## 2012-11-19 MED ORDER — METOCLOPRAMIDE HCL 5 MG/ML IJ SOLN
10.0000 mg | Freq: Once | INTRAMUSCULAR | Status: AC
  Administered 2012-11-19: 10 mg via INTRAVENOUS
  Filled 2012-11-19: qty 2

## 2012-11-19 MED ORDER — DEXAMETHASONE SODIUM PHOSPHATE 10 MG/ML IJ SOLN
10.0000 mg | Freq: Once | INTRAMUSCULAR | Status: AC
  Administered 2012-11-19: 10 mg via INTRAVENOUS
  Filled 2012-11-19: qty 1

## 2012-11-19 MED ORDER — POTASSIUM CHLORIDE CRYS ER 20 MEQ PO TBCR: 40.0000 meq | EXTENDED_RELEASE_TABLET | Freq: Once | ORAL | Status: DC

## 2012-11-19 MED ORDER — POTASSIUM CHLORIDE 10 MEQ/100ML IV SOLN
10.0000 meq | Freq: Once | INTRAVENOUS | Status: AC
  Administered 2012-11-19: 10 meq via INTRAVENOUS
  Filled 2012-11-19: qty 100

## 2012-11-19 MED ORDER — ONDANSETRON 4 MG PO TBDP: 8.0000 mg | ORAL_TABLET | Freq: Once | ORAL | Status: DC

## 2012-11-19 MED ORDER — KETOROLAC TROMETHAMINE 30 MG/ML IJ SOLN
30.0000 mg | Freq: Once | INTRAMUSCULAR | Status: AC
  Administered 2012-11-19: 30 mg via INTRAVENOUS
  Filled 2012-11-19: qty 1

## 2012-11-19 MED ORDER — ONDANSETRON 8 MG PO TBDP: ORAL_TABLET | ORAL | Status: DC

## 2012-11-19 MED ORDER — DIPHENHYDRAMINE HCL 50 MG/ML IJ SOLN
25.0000 mg | Freq: Once | INTRAMUSCULAR | Status: AC
  Administered 2012-11-19: 25 mg via INTRAVENOUS
  Filled 2012-11-19: qty 1

## 2012-11-19 MED ORDER — HYDROCODONE-ACETAMINOPHEN 5-325 MG PO TABS
2.0000 | ORAL_TABLET | Freq: Once | ORAL | Status: AC
  Administered 2012-11-19: 2 via ORAL
  Filled 2012-11-19: qty 2

## 2012-11-19 NOTE — ED Provider Notes (Signed)
Medical screening examination/treatment/procedure(s) were performed by non-physician practitioner and as supervising physician I was immediately available for consultation/collaboration.  EKG Interpretation   None        Kasyn Rolph K Ainhoa Rallo-Rasch, MD 11/19/12 0559 

## 2012-11-19 NOTE — ED Notes (Signed)
Pt. Reports nausea and vomitting onset yesterday with headache . Denies diarrhea . No fever or chills.

## 2012-11-19 NOTE — ED Notes (Signed)
PA at bedside.

## 2012-11-19 NOTE — ED Provider Notes (Signed)
CSN: [id number]     Arrival date & time 11/19/12  0151 History   First MD Initiated Contact with Patient 11/19/12 [phone]     Chief Complaint  Patient presents with  . Emesis   HPI  History provided by the patient. Patient is a 52 year old female with history of hypertension, cholecystectomy, appendectomy, total hysterectomy who presents with complaints of nausea vomiting. Patient first had some nausea Monday morning. This was later followed with episodes of vomiting. She took her home and diabetic medications including Phenergan but states this does not help and she continued to vomit. She was having difficulty and did not take her normal doses of chronic pain medications. She normally takes hydrocodone 10 mg. Since that time she has developed headache across her forehead and bilateral temples. Headache is worse with vomiting. She denies any diarrhea symptoms. Denies any fever. No dysuria, hematuria urinary frequency. No other aggravating or alleviating factors. No other associated symptoms.    Past Medical History  Diagnosis Date  . Hypertension   . Hypercholesteremia   . Cancer    Past Surgical History  Procedure Laterality Date  . Abdominal surgery    . Abdominal hysterectomy     No family history on file. History  Substance Use Topics  . Smoking status: Never Smoker   . Smokeless tobacco: Not on file  . Alcohol Use: No   OB History   Grav Para Term Preterm Abortions TAB SAB Ect Mult Living                 Review of Systems  Constitutional: Positive for chills. Negative for fever and diaphoresis.  Respiratory: Negative for shortness of breath.   Cardiovascular: Negative for chest pain.  Gastrointestinal: Positive for nausea and vomiting. Negative for diarrhea and constipation.  Genitourinary: Negative for dysuria, frequency, hematuria and flank pain.  Neurological: Positive for headaches.  All other systems reviewed and are negative.    Allergies  Percocet; Tramadol;  and Vistaril  Home Medications   Current Outpatient Rx  Name  Route  Sig  Dispense  Refill  . acetaminophen (TYLENOL) 325 MG tablet   Oral   Take 325 mg by mouth daily as needed for pain.         . baclofen (LIORESAL) 10 MG tablet   Oral   Take 10 mg by mouth 2 (two) times daily.         Marland Kitchen gabapentin (NEURONTIN) 300 MG capsule   Oral   Take 300 mg by mouth 3 (three) times daily.         Marland Kitchen HYDROcodone-acetaminophen (NORCO) 10-325 MG per tablet   Oral   Take 1-5 tablets by mouth See admin instructions. Take up to 5 tablets every day         . lisinopril-hydrochlorothiazide (PRINZIDE,ZESTORETIC) 20-12.5 MG per tablet   Oral   Take 1 tablet by mouth daily.         Marland Kitchen omeprazole (PRILOSEC) 20 MG capsule   Oral   Take 20 mg by mouth 2 (two) times daily.          . ondansetron (ZOFRAN) 8 MG tablet   Oral   Take 1 tablet (8 mg total) by mouth every 8 (eight) hours as needed for nausea.   12 tablet   0   . promethazine (PHENERGAN) 25 MG tablet   Oral   Take 25 mg by mouth 2 (two) times daily.         . rosuvastatin (  CRESTOR) 10 MG tablet   Oral   Take 10 mg by mouth daily.          BP 115/68  Pulse 101  Temp(Src) 98.6 F (37 C) (Oral)  Resp 18  Ht 5\' 3"  (1.6 m)  Wt 129 lb 9.6 oz (58.786 kg)  BMI 22.96 kg/m2  SpO2 96% Physical Exam  Nursing note and vitals reviewed. Constitutional: She is oriented to person, place, and time. She appears well-developed and well-nourished. No distress.  HENT:  Head: Normocephalic.  Mouth/Throat: Oropharynx is clear and moist.  Eyes: Conjunctivae are normal.  Cardiovascular: Normal rate and regular rhythm.   Pulmonary/Chest: Effort normal and breath sounds normal. No respiratory distress. She has no wheezes. She has no rales.  Abdominal: Soft. She exhibits no distension. There is no rebound and no guarding.  Central midline abdominal scar. Abdomen is soft. No distention or clinical signs concerning for SBO. Very  minimal diffuse tenderness. No peritoneal signs the  Musculoskeletal: Normal range of motion.  Neurological: She is alert and oriented to person, place, and time.  Skin: Skin is warm and dry. No rash noted.  Psychiatric: She has a normal mood and affect. Her behavior is normal.    ED Course  Procedures    COORDINATION OF CARE:  Nursing notes reviewed. Vital signs reviewed. Initial pt interview and examination performed.   4:18 AM-patient seen and evaluated. She appears in mild discomfort but no acute distress. Does not appear severely ill or toxic.   Patient having improvement of nausea and vomiting after medications. She reports only slight improvements of headache after headache cocktail. She states that some of her chronic back and leg pains are becoming worse as well she has not had her normal hydrocodone. I have agreed to give her normal dose of hydrocodone.  Patient now feeling better after hydrocodone. She needs to have no nausea vomiting here in the emergency department. She appears stable for discharge home.    Results for orders placed during the hospital encounter of 11/19/12  CBC WITH DIFFERENTIAL      Result Value Range   WBC 8.4  4.0 - 10.5 K/uL   RBC 4.20  3.87 - 5.11 MIL/uL   Hemoglobin 13.2  12.0 - 15.0 g/dL   HCT 16.1  09.6 - 04.5 %   MCV 93.6  78.0 - 100.0 fL   MCH 31.4  26.0 - 34.0 pg   MCHC 33.6  30.0 - 36.0 g/dL   RDW 40.9  81.1 - 91.4 %   Platelets 233  150 - 400 K/uL   Neutrophils Relative % 66  43 - 77 %   Neutro Abs 5.5  1.7 - 7.7 K/uL   Lymphocytes Relative 28  12 - 46 %   Lymphs Abs 2.3  0.7 - 4.0 K/uL   Monocytes Relative 6  3 - 12 %   Monocytes Absolute 0.5  0.1 - 1.0 K/uL   Eosinophils Relative 0  0 - 5 %   Eosinophils Absolute 0.0  0.0 - 0.7 K/uL   Basophils Relative 0  0 - 1 %   Basophils Absolute 0.0  0.0 - 0.1 K/uL  COMPREHENSIVE METABOLIC PANEL      Result Value Range   Sodium 138  135 - 145 mEq/L   Potassium 2.9 (*) 3.5 - 5.1 mEq/L    Chloride 96  96 - 112 mEq/L   CO2 28  19 - 32 mEq/L   Glucose, Bld 118 (*) 70 -  99 mg/dL   BUN 18  6 - 23 mg/dL   Creatinine, Ser 1.61  0.50 - 1.10 mg/dL   Calcium 09.6  8.4 - 04.5 mg/dL   Total Protein 7.7  6.0 - 8.3 g/dL   Albumin 4.2  3.5 - 5.2 g/dL   AST 14  0 - 37 U/L   ALT 11  0 - 35 U/L   Alkaline Phosphatase 65  39 - 117 U/L   Total Bilirubin 0.3  0.3 - 1.2 mg/dL   GFR calc non Af Amer 80 (*) >90 mL/min   GFR calc Af Amer >90  >90 mL/min       MDM   1. Nausea & vomiting   2. Chronic pain        Angus Seller, PA-C 11/19/12 [phone]

## 2014-07-08 ENCOUNTER — Encounter: Payer: Self-pay | Admitting: *Deleted

## 2014-07-23 ENCOUNTER — Encounter: Payer: Medicare Other | Admitting: Medical

## 2014-08-25 ENCOUNTER — Other Ambulatory Visit: Payer: Self-pay | Admitting: Internal Medicine

## 2014-08-25 DIAGNOSIS — Z1231 Encounter for screening mammogram for malignant neoplasm of breast: Secondary | ICD-10-CM

## 2014-09-29 ENCOUNTER — Ambulatory Visit
Admission: RE | Admit: 2014-09-29 | Discharge: 2014-09-29 | Disposition: A | Discharge status: Home / Self Care | Payer: Medicare Other | Source: Ambulatory Visit | Attending: Internal Medicine | Admitting: Internal Medicine

## 2014-09-29 DIAGNOSIS — Z1231 Encounter for screening mammogram for malignant neoplasm of breast: Secondary | ICD-10-CM

## 2014-10-01 ENCOUNTER — Encounter: Payer: Self-pay | Admitting: Family

## 2014-10-01 ENCOUNTER — Ambulatory Visit (INDEPENDENT_AMBULATORY_CARE_PROVIDER_SITE_OTHER): Payer: Medicare Other | Admitting: Family

## 2014-10-01 ENCOUNTER — Other Ambulatory Visit (HOSPITAL_COMMUNITY)
Admission: RE | Admit: 2014-10-01 | Discharge: 2014-10-01 | Disposition: A | Discharge status: Home / Self Care | Payer: Medicare Other | Source: Ambulatory Visit | Attending: Family Medicine | Admitting: Family Medicine

## 2014-10-01 VITALS — BP 149/88 | HR 82 | Temp 98.4°F | Ht 63.0 in | Wt 138.9 lb

## 2014-10-01 DIAGNOSIS — Z1151 Encounter for screening for human papillomavirus (HPV): Secondary | ICD-10-CM | POA: Insufficient documentation

## 2014-10-01 DIAGNOSIS — Z90710 Acquired absence of both cervix and uterus: Secondary | ICD-10-CM | POA: Diagnosis not present

## 2014-10-01 DIAGNOSIS — Z124 Encounter for screening for malignant neoplasm of cervix: Secondary | ICD-10-CM | POA: Diagnosis present

## 2014-10-01 DIAGNOSIS — Z01419 Encounter for gynecological examination (general) (routine) without abnormal findings: Secondary | ICD-10-CM | POA: Diagnosis not present

## 2014-10-01 NOTE — Progress Notes (Signed)
Subjective:    Katrina Vasquez is a 54 y.o. female who presents for annual exam. Patient is s/p total hysterectomy secondary to cervical cancer. Patient has had yearly pap smears since 2010 which have all been negative. The patient has no complaints today. The patient is not currently taking hormone replacement therapy. No vaginal bleeding.  The patient wears seatbelts: yes. The patient participates in regular exercise: not asked. Has the patient ever been transfused or tattooed?: not asked. The patient reports that there is not domestic violence in her life.   Menstrual History: OB History    No data available      No LMP recorded. Patient has had a hysterectomy.    The following portions of the patient's history were reviewed and updated as appropriate: allergies, current medications, past family history, past medical history, past social history, past surgical history and problem list.  Review of Systems A comprehensive review of systems was negative.    Objective:    BP 149/88 mmHg  Pulse 82  Temp(Src) 98.4 F (36.9 C)  Ht 5\' 3"  (1.6 m)  Wt 138 lb 14.4 oz (63.005 kg)  BMI 24.61 kg/m2  General Appearance:    Alert, cooperative, no distress, appears stated age  Head:    Normocephalic, without obvious abnormality, atraumatic  Eyes:    PERRL, conjunctiva/corneas clear, EOM's intact, fundi    benign, both eyes  Ears:    Normal TM's and external ear canals, both ears  Nose:   Nares normal, septum midline, mucosa normal, no drainage    or sinus tenderness  Throat:   Lips, mucosa, and tongue normal; teeth and gums normal  Neck:   Supple, symmetrical, trachea midline, no adenopathy;    thyroid:  no enlargement/tenderness/nodules; no carotid   bruit or JVD  Back:     Symmetric, no curvature, ROM normal, no CVA tenderness  Lungs:     Clear to auscultation bilaterally, respirations unlabored  Chest Wall:    No tenderness or deformity   Heart:    Regular rate and rhythm, S1 and S2  normal, no murmur, rub   or gallop  Breast Exam:    Not examined  Abdomen:     Several surgical scars noted. Soft, non-tender, bowel sounds active all four quadrants, no masses, no organomegaly,    Genitalia:    Normal female external genitalia. Speculum exam performed. Cervix absent. No internal lesion, discharge or tenderness noted.   Rectal:    Deferred  Extremities:   Extremities normal, atraumatic, no cyanosis or edema  Pulses:   2+ and symmetric all extremities  Skin:   Skin color, texture, turgor normal, no rashes or lesions  Lymph nodes:   Cervical, supraclavicular, and axillary nodes normal  Neurologic:   CNII-XII intact, normal strength, sensation and reflexes    throughout      Assessment:    Normal gyn exam  Pap smear obtained from posterior vaginal wall.    Plan:   All questions answered. Await pap smear results. Mammogram normal 2 days ago - Breast exam deferred today. Will have repeat mammogram in 1 year.  Breast self exam technique reviewed and patient encouraged to perform self-exam monthly. Follow up in 1 year. Will need yearly pap smear for 20 years s/p hysterectomy.   I examined pt and agree with documentation above and resident plan of care. Present in room for both the assessment and exam.  Gwen Pounds, CNM

## 2014-10-05 LAB — CYTOLOGY - PAP

## 2015-05-01 ENCOUNTER — Emergency Department (HOSPITAL_COMMUNITY): Payer: Medicare Other

## 2015-05-01 ENCOUNTER — Emergency Department (HOSPITAL_COMMUNITY)
Admission: EM | Admit: 2015-05-01 | Discharge: 2015-05-01 | Disposition: A | Discharge status: Home / Self Care | Payer: Medicare Other | Attending: Emergency Medicine | Admitting: Emergency Medicine

## 2015-05-01 ENCOUNTER — Encounter (HOSPITAL_COMMUNITY): Payer: Self-pay | Admitting: Radiology

## 2015-05-01 DIAGNOSIS — E7800 Pure hypercholesterolemia, unspecified: Secondary | ICD-10-CM | POA: Diagnosis not present

## 2015-05-01 DIAGNOSIS — R112 Nausea with vomiting, unspecified: Secondary | ICD-10-CM | POA: Diagnosis present

## 2015-05-01 DIAGNOSIS — R1084 Generalized abdominal pain: Secondary | ICD-10-CM | POA: Insufficient documentation

## 2015-05-01 DIAGNOSIS — R51 Headache: Secondary | ICD-10-CM | POA: Insufficient documentation

## 2015-05-01 DIAGNOSIS — Z79899 Other long term (current) drug therapy: Secondary | ICD-10-CM | POA: Diagnosis not present

## 2015-05-01 DIAGNOSIS — I10 Essential (primary) hypertension: Secondary | ICD-10-CM | POA: Insufficient documentation

## 2015-05-01 DIAGNOSIS — Z859 Personal history of malignant neoplasm, unspecified: Secondary | ICD-10-CM | POA: Insufficient documentation

## 2015-05-01 DIAGNOSIS — I823 Embolism and thrombosis of renal vein: Secondary | ICD-10-CM | POA: Diagnosis not present

## 2015-05-01 LAB — CBC WITH DIFFERENTIAL/PLATELET
BASOS PCT: 0 %
Basophils Absolute: 0 10*3/uL (ref 0.0–0.1)
Eosinophils Absolute: 0.2 10*3/uL (ref 0.0–0.7)
Eosinophils Relative: 2 %
HCT: 45.7 % (ref 36.0–46.0)
HEMOGLOBIN: 15.2 g/dL — AB (ref 12.0–15.0)
LYMPHS PCT: 35 %
Lymphs Abs: 3 10*3/uL (ref 0.7–4.0)
MCH: 32.5 pg (ref 26.0–34.0)
MCHC: 33.3 g/dL (ref 30.0–36.0)
MCV: 97.9 fL (ref 78.0–100.0)
Monocytes Absolute: 0.6 10*3/uL (ref 0.1–1.0)
Monocytes Relative: 7 %
Neutro Abs: 4.7 10*3/uL (ref 1.7–7.7)
Neutrophils Relative %: 56 %
Platelets: ADEQUATE 10*3/uL (ref 150–400)
RBC: 4.67 MIL/uL (ref 3.87–5.11)
RDW: 13.1 % (ref 11.5–15.5)
WBC: 8.5 10*3/uL (ref 4.0–10.5)

## 2015-05-01 LAB — COMPREHENSIVE METABOLIC PANEL
ALK PHOS: 53 U/L (ref 38–126)
ALT: 30 U/L (ref 14–54)
AST: 26 U/L (ref 15–41)
Albumin: 4.3 g/dL (ref 3.5–5.0)
Anion gap: 21 — ABNORMAL HIGH (ref 5–15)
BUN: 14 mg/dL (ref 6–20)
CALCIUM: 10.3 mg/dL (ref 8.9–10.3)
CHLORIDE: 106 mmol/L (ref 101–111)
CO2: 16 mmol/L — AB (ref 22–32)
CREATININE: 0.95 mg/dL (ref 0.44–1.00)
GFR calc non Af Amer: >60 mL/min (ref >60)
Glucose, Bld: 100 mg/dL — ABNORMAL HIGH (ref 65–99)
Potassium: 3.5 mmol/L (ref 3.5–5.1)
SODIUM: 143 mmol/L (ref 135–145)
Total Bilirubin: 0.9 mg/dL (ref 0.3–1.2)
Total Protein: 8.3 g/dL — ABNORMAL HIGH (ref 6.5–8.1)

## 2015-05-01 LAB — URINALYSIS, ROUTINE W REFLEX MICROSCOPIC
Glucose, UA: NEGATIVE mg/dL
Ketones, ur: >80 mg/dL — AB
Leukocytes, UA: NEGATIVE
Nitrite: NEGATIVE
PROTEIN: 30 mg/dL — AB
pH: 5.5 (ref 5.0–8.0)

## 2015-05-01 LAB — URINE MICROSCOPIC-ADD ON
RBC / HPF: NONE SEEN RBC/hpf (ref 0–5)
WBC UA: NONE SEEN WBC/hpf (ref 0–5)

## 2015-05-01 LAB — LIPASE, BLOOD: Lipase: 16 U/L (ref 11–51)

## 2015-05-01 MED ORDER — SODIUM CHLORIDE 0.9 % IV BOLUS (SEPSIS)
1000.0000 mL | Freq: Once | INTRAVENOUS | Status: AC
  Administered 2015-05-01: 1000 mL via INTRAVENOUS

## 2015-05-01 MED ORDER — DIPHENHYDRAMINE HCL 50 MG/ML IJ SOLN
25.0000 mg | Freq: Once | INTRAMUSCULAR | Status: AC
  Administered 2015-05-01: 25 mg via INTRAVENOUS
  Filled 2015-05-01: qty 1

## 2015-05-01 MED ORDER — ONDANSETRON HCL 4 MG/2ML IJ SOLN
4.0000 mg | Freq: Once | INTRAMUSCULAR | Status: AC
  Administered 2015-05-01: 4 mg via INTRAVENOUS
  Filled 2015-05-01: qty 2

## 2015-05-01 MED ORDER — PROMETHAZINE HCL 25 MG PO TABS: 25.0000 mg | ORAL_TABLET | Freq: Four times a day (QID) | ORAL | Status: DC | PRN

## 2015-05-01 MED ORDER — MORPHINE SULFATE (PF) 4 MG/ML IV SOLN
4.0000 mg | Freq: Once | INTRAVENOUS | Status: AC
  Administered 2015-05-01: 4 mg via INTRAVENOUS
  Filled 2015-05-01: qty 1

## 2015-05-01 MED ORDER — METOCLOPRAMIDE HCL 5 MG/ML IJ SOLN
10.0000 mg | Freq: Once | INTRAMUSCULAR | Status: AC
  Administered 2015-05-01: 10 mg via INTRAVENOUS
  Filled 2015-05-01: qty 2

## 2015-05-01 MED ORDER — IOPAMIDOL (ISOVUE-300) INJECTION 61%
INTRAVENOUS | Status: AC
Start: 2015-05-01 — End: 2015-05-01
  Administered 2015-05-01: 100 mL
  Filled 2015-05-01: qty 100

## 2015-05-01 MED ORDER — SODIUM CHLORIDE 0.9 % IV BOLUS (SEPSIS)
1000.0000 mL | Freq: Once | INTRAVENOUS | Status: AC
Start: 2015-05-01 — End: 2015-05-01
  Administered 2015-05-01: 1000 mL via INTRAVENOUS

## 2015-05-01 NOTE — Discharge Instructions (Signed)
Stay hydrated.   Take phenergan for nausea.   Take your pain meds as prescribed.   See your doctor.   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 You should talk to your doctor, consider hypercoagulable workup. Need repeat chemistry in a week.   Return to ED if you have severe abdominal pain, vomiting, fever, severe headaches.

## 2015-05-01 NOTE — ED Provider Notes (Signed)
CSN: [id number]     Arrival date & time 05/01/15  1027 History   First MD Initiated Contact with Patient 05/01/15 1036     No chief complaint on file.    (Consider location/radiation/quality/duration/timing/severity/associated sxs/prior Treatment) The history is provided by the patient.  KAWANA ROSELAND is a 55 y.o. female hx of HTN, HL, cervical cancer s/p hysterectomy here with abdominal pain, headache, vomiting. Lower abdominal pain for the last 3-4 days associated with numerous episodes of vomiting. She usually takes Phenergan at home but was unable to keep Phenergan or liquids or any medications down. She also has headaches only when she vomits. Denies any fevers or neck pain. States that she usually does not have a history of headaches. He had a previous abdominal hysterectomy and SBO. Still passing gas and has diarrhea as well.    Past Medical History  Diagnosis Date  . Hypertension   . Hypercholesteremia   . Cancer [facility])    Past Surgical History  Procedure Laterality Date  . Abdominal surgery    . Abdominal hysterectomy     No family history on file. Social History  Substance Use Topics  . Smoking status: Never Smoker   . Smokeless tobacco: None  . Alcohol Use: No   OB History    No data available     Review of Systems  Gastrointestinal: Positive for vomiting and abdominal pain.  Neurological: Positive for headaches.  All other systems reviewed and are negative.     Allergies  Percocet; Tramadol; and Vistaril  Home Medications   Prior to Admission medications   Medication Sig Start Date End Date Taking? Authorizing Provider  acetaminophen (TYLENOL) 325 MG tablet Take 325 mg by mouth daily as needed for pain.   Yes Historical Provider, MD  baclofen (LIORESAL) 10 MG tablet Take 10 mg by mouth 2 (two) times daily.   Yes Historical Provider, MD  gabapentin (NEURONTIN) 300 MG capsule Take 300 mg by mouth 3 (three) times daily.   Yes Historical Provider, MD   HYDROcodone-acetaminophen (NORCO) 10-325 MG per tablet Take 1-5 tablets by mouth See admin instructions. Take up to 5 tablets every day   Yes Historical Provider, MD  lisinopril-hydrochlorothiazide (PRINZIDE,ZESTORETIC) 20-12.5 MG per tablet Take 1 tablet by mouth daily.   Yes Historical Provider, MD  omeprazole (PRILOSEC) 20 MG capsule Take 20 mg by mouth 2 (two) times daily.    Yes Historical Provider, MD  promethazine (PHENERGAN) 25 MG tablet Take 25 mg by mouth 2 (two) times daily.   Yes Historical Provider, MD  rosuvastatin (CRESTOR) 10 MG tablet Take 10 mg by mouth daily.   Yes Historical Provider, MD  tiZANidine (ZANAFLEX) 2 MG tablet Take 2 mg by mouth 2 (two) times daily as needed for muscle spasms.  03/16/15  Yes Historical Provider, MD   BP 133/94 mmHg  Pulse 102  Temp(Src) 97.7 F (36.5 C) (Oral)  Resp 16  SpO2 98% Physical Exam  Constitutional: She is oriented to person, place, and time. She appears well-developed.  Uncomfortable, vomiting   HENT:  Head: Normocephalic.  MM slightly dry   Eyes: Conjunctivae are normal. Pupils are equal, round, and reactive to light.  Neck: Normal range of motion. Neck supple.  Cardiovascular: Normal rate, regular rhythm and normal heart sounds.   Pulmonary/Chest: Effort normal and breath sounds normal. No respiratory distress. She has no wheezes. She has no rales.  Abdominal: Soft. Bowel sounds are normal.  Mild diffuse lower abdominal tenderness, worse in  RLQ   Musculoskeletal: Normal range of motion. She exhibits no edema or tenderness.  Neurological: She is alert and oriented to person, place, and time.  CN 2-12 intact, nl strength throughout   Skin: Skin is warm and dry.  Psychiatric: She has a normal mood and affect. Her behavior is normal. Judgment and thought content normal.  Nursing note and vitals reviewed.   ED Course  Procedures (including critical care time) Labs Review Labs Reviewed  CBC WITH DIFFERENTIAL/PLATELET -  Abnormal; Notable for the following:    Hemoglobin 15.2 (*)    All other components within normal limits  COMPREHENSIVE METABOLIC PANEL - Abnormal; Notable for the following:    CO2 16 (*)    Glucose, Bld 100 (*)    Total Protein 8.3 (*)    Anion gap 21 (*)    All other components within normal limits  LIPASE, BLOOD  URINALYSIS, ROUTINE W REFLEX MICROSCOPIC (NOT AT [facility])    Imaging Review Ct Head Wo Contrast  05/01/2015  CLINICAL DATA:  Left-sided frontal headache for 2 days, initial encounter EXAM: CT HEAD WITHOUT CONTRAST TECHNIQUE: Contiguous axial images were obtained from the base of the skull through the vertex without intravenous contrast. COMPARISON:  None. FINDINGS: The bony calvarium is intact. No gross soft tissue abnormality is seen. No findings to suggest acute hemorrhage, acute infarction or space-occupying mass lesion are noted. IMPRESSION: No acute intracranial abnormality noted. Electronically Signed   By: Inez Catalina M.D.   On: 05/01/2015 14:24   Ct Abdomen Pelvis W Contrast  05/01/2015  CLINICAL DATA:  Periumbilical abdominal pain for 2 days, initial encounter EXAM: CT ABDOMEN AND PELVIS WITH CONTRAST TECHNIQUE: Multidetector CT imaging of the abdomen and pelvis was performed using the standard protocol following bolus administration of intravenous contrast. CONTRAST:  152mL ISOVUE-300 IOPAMIDOL (ISOVUE-300) INJECTION 61% COMPARISON:  None. FINDINGS: Lung bases are free of acute infiltrate or sizable effusion. The gallbladder has been surgically removed. The liver, spleen, adrenal glands and pancreas are within normal limits. Kidneys are well visualized bilaterally. No renal calculi or urinary tract obstructive changes are seen. The left renal vein demonstrates filling defect consistent with renal vein thrombosis. This extends to the junction of the left ovarian vein and left renal vein. It does not cross the midline. The appendix is not well visualized although no inflammatory  changes are seen. Postsurgical changes are seen in the right lower quadrant. Scattered mild diverticular changes noted within the colon. No diverticulitis is seen. The uterus has been surgically removed. The bladder is partially distended. The bony structures are within normal limits. IMPRESSION:.: IMPRESSION:. 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 It is not occlusive in nature. Short-term followup following an appropriate course of anticoagulation is recommended to assess for resolution. Postsurgical changes No other focal abnormality is seen. Electronically Signed   By: Inez Catalina M.D.   On: 05/01/2015 14:31   I have personally reviewed and evaluated these images and lab results as part of my medical decision-making.   EKG Interpretation None      MDM   Final diagnoses:  None    Astha A Doung is a 55 y.o. female here with headache, vomiting, abdominal pain. Likely SBO vs gastro. No meningeal signs, afebrile. Will get CT head, CT ab/pel, labs. Will give migraine cocktail and reassess   3:08 PM Given migraine cocktail. Felt better, able to keep down PO fluids. Labs showed CO2 16, AG 21 likely from dehydration. Cr nl, given 2 L  NS bolus. CT head nl. CT ab/pel showed left renal vein thrombosis that is not occlusive. Called Dr. Learta Codding from hematology. He states that given patient has no renal failure and is not occlusive, can get outpatient workup for hypercoagulable state. Doesn't need to be on anticoagulation unless patient has nephrotic syndrome (only 30 protein in UA) or complete occlusion. Also consulted Dr. Melvia Heaps from nephrology, who agrees but states that he doesn't deal with this much. Looked on up to date and if patient has no hx of afib or nephrotic syndrome, do not need anticoagulation. Told her to call PCP on Monday and get repeat BMP and hypercoagulable workup in the office.     Wandra Arthurs, MD 05/01/15 [phone]

## 2015-08-30 ENCOUNTER — Encounter: Payer: Self-pay | Admitting: Internal Medicine

## 2015-08-30 ENCOUNTER — Telehealth: Payer: Self-pay | Admitting: Internal Medicine

## 2015-08-30 NOTE — Telephone Encounter (Signed)
Appointment scheduled with [facility] on 8/16. Patient agreed to date and time. Demographics verified. Letter mailed to the patient and faxed to the referring

## 2015-08-30 NOTE — Telephone Encounter (Signed)
Patient scheduled to see [facility] on 8/16 at 2:15pm. Patient agreed. Demographics verified. Letter to the referring and mailed to the patient.

## 2015-09-08 ENCOUNTER — Ambulatory Visit (HOSPITAL_BASED_OUTPATIENT_CLINIC_OR_DEPARTMENT_OTHER): Payer: Medicare Other | Admitting: Internal Medicine

## 2015-09-08 ENCOUNTER — Encounter: Payer: Self-pay | Admitting: Internal Medicine

## 2015-09-08 ENCOUNTER — Other Ambulatory Visit: Payer: Self-pay | Admitting: Medical Oncology

## 2015-09-08 DIAGNOSIS — Z832 Family history of diseases of the blood and blood-forming organs and certain disorders involving the immune mechanism: Secondary | ICD-10-CM | POA: Diagnosis not present

## 2015-09-08 DIAGNOSIS — I823 Embolism and thrombosis of renal vein: Secondary | ICD-10-CM | POA: Insufficient documentation

## 2015-09-08 DIAGNOSIS — R1031 Right lower quadrant pain: Secondary | ICD-10-CM | POA: Diagnosis not present

## 2015-09-08 HISTORY — DX: Embolism and thrombosis of renal vein: I82.3

## 2015-09-08 NOTE — Progress Notes (Signed)
[place] [phone]   [phone]  CONSULT NOTE  REFERRING PHYSICIAN: Nolene Ebbs, MD  REASON FOR CONSULTATION:  55 years old African-American female with history of left renal vein thrombosis  HPI Katrina Vasquez is a 55 y.o. female was past medical history significant for hypertension, dyslipidemia, history of cervical cancer 5 years ago status post hysterectomy, status post rupture appendix during pregnancy with a lot of abdominal adhesion as well as questionable history of cardiomyopathy. The patient was seen at the emergency department at [facility] on 05/01/2015 complaining of right lower quadrant abdominal pain. CT scan of the abdomen and pelvis performed at that time showed changes consistent with left renal vein thrombus but it was not conclusive in nature. No other focal abnormality was seen and there was postsurgical changes. The patient was seen by her primary care physician. She had hypercoagulable workup performed that showed no significant abnormalities except for slightly elevated homocysteine level of 13.2. The patient was not started on any anticoagulation. She was referred to me today for evaluation and recommendation regarding her condition. She continues to have pain on the right lower quadrant with mild nausea but no vomiting. She takes Phenergan when as-needed basis for the nausea. She denied having any significant chest pain, shortness of breath, cough or hemoptysis. The patient denied having any significant weight loss or night sweats. Family history significant for mother still alive and has anemia, father died from stroke. The patient is a widow and has one son. She is currently on disability. She denied having any history of smoking, alcohol or drug abuse.  HPI  Past Medical History:  Diagnosis Date  . Cancer ([place])   . Hypercholesteremia   . Hypertension     Past Surgical History:  Procedure Laterality Date    . ABDOMINAL HYSTERECTOMY    . ABDOMINAL SURGERY      History reviewed. No pertinent family history.  Social History Social History  Substance Use Topics  . Smoking status: Never Smoker  . Smokeless tobacco: Never Used  . Alcohol use No    Allergies  Allergen Reactions  . Percocet [Oxycodone-Acetaminophen]     Nausea/vomiting  . Tramadol Nausea And Vomiting  . Vistaril [Hydroxyzine Hcl] Itching    Current Outpatient Prescriptions  Medication Sig Dispense Refill  . acetaminophen (TYLENOL) 325 MG tablet Take 325 mg by mouth daily as needed for pain.    Marland Kitchen amitriptyline (ELAVIL) 25 MG tablet Take 25 mg by mouth at bedtime.  3  . baclofen (LIORESAL) 10 MG tablet Take 10 mg by mouth 2 (two) times daily.    Marland Kitchen gabapentin (NEURONTIN) 300 MG capsule Take 300 mg by mouth 3 (three) times daily.    Marland Kitchen HYDROcodone-acetaminophen (NORCO) 10-325 MG per tablet Take 1-5 tablets by mouth See admin instructions. Take up to 5 tablets every day    . HYSINGLA ER 20 MG T24A Take 20 mg by mouth daily.  0  . lisinopril-hydrochlorothiazide (PRINZIDE,ZESTORETIC) 20-12.5 MG per tablet Take 1 tablet by mouth daily.    Marland Kitchen MOVANTIK 25 MG TABS tablet Take 25 mg by mouth daily.  2  . omeprazole (PRILOSEC) 20 MG capsule Take 20 mg by mouth 2 (two) times daily.     . promethazine (PHENERGAN) 25 MG tablet Take 1 tablet (25 mg total) by mouth every 6 (six) hours as needed for nausea or vomiting. 15 tablet 0  . rosuvastatin (CRESTOR) 10 MG tablet Take 10 mg by mouth  daily.    . tiZANidine (ZANAFLEX) 2 MG tablet Take 2 mg by mouth 2 (two) times daily as needed for muscle spasms.   2   No current facility-administered medications for this visit.     Review of Systems  Constitutional: negative Eyes: negative Ears, nose, mouth, throat, and face: negative Respiratory: negative Cardiovascular: negative Gastrointestinal: positive for abdominal pain Genitourinary:negative Integument/breast:  negative Hematologic/lymphatic: negative Musculoskeletal:negative Neurological: negative Behavioral/Psych: negative Endocrine: negative Allergic/Immunologic: negative  Physical Exam  [id number], healthy, no distress, well nourished and well developed SKIN: skin color, texture, turgor are normal, no rashes or significant lesions HEAD: Normocephalic, No masses, lesions, tenderness or abnormalities EYES: normal, PERRLA EARS: External ears normal, Canals clear OROPHARYNX:no exudate, no erythema and lips, buccal mucosa, and tongue normal  NECK: supple, no adenopathy, no JVD LYMPH:  no palpable lymphadenopathy, no hepatosplenomegaly BREAST:not examined LUNGS: clear to auscultation , and palpation HEART: regular rate & rhythm, no murmurs and no gallops ABDOMEN:abdomen soft, normal bowel sounds, no masses or organomegaly and mild tenderness to palpation in the right lower quadrant BACK: Back symmetric, no curvature., No CVA tenderness EXTREMITIES:no joint deformities, effusion, or inflammation, no edema, no skin discoloration  NEURO: alert & oriented x 3 with fluent speech, no focal motor/sensory deficits  PERFORMANCE STATUS: ECOG 1  LABORATORY DATA: Lab Results  Component Value Date   WBC 8.5 05/01/2015   HGB 15.2 (H) 05/01/2015   HCT 45.7 05/01/2015   MCV 97.9 05/01/2015   PLT  05/01/2015    PLATELET CLUMPS NOTED ON SMEAR, COUNT APPEARS ADEQUATE      Chemistry      Component Value Date/Time   NA 143 05/01/2015 1118   K 3.5 05/01/2015 1118   CL 106 05/01/2015 1118   CO2 16 (L) 05/01/2015 1118   BUN 14 05/01/2015 1118   CREATININE 0.95 05/01/2015 1118      Component Value Date/Time   CALCIUM 10.3 05/01/2015 1118   ALKPHOS 53 05/01/2015 1118   AST 26 05/01/2015 1118   ALT 30 05/01/2015 1118   BILITOT 0.9 05/01/2015 1118       RADIOGRAPHIC STUDIES: No results found.  ASSESSMENT: This is a very pleasant 55 years old African-American female with left renal  nonocclusive thrombosis seen on the previous CT scan of the abdomen and pelvis in April 2017. The patient had hypercoagulability workup that showed no significant abnormalities except for slightly elevated homocysteine level. She is not currently on any anticoagulation. She is feeling fine except for mild tenderness on the right lower quadrant likely secondary to abdominal adhesions secondary to multiple surgeries in the past.   PLAN: I had a lengthy discussion with the patient today about her condition. I recommended for her to have repeat CT scan of the abdomen and pelvis for reevaluation of the left renal vein thrombosis. The patient has persistent occlusive left renal thrombosis, I may consider her for anticoagulation. For the elevated homocysteine level, I recommended for the patient to start taking folic acid [id number] g by mouth daily over the counter medication. I will see her back for follow-up visit in one month's for reevaluation and discussion of her scan results and further recommendation regarding her condition. The patient was advised to call immediately if she has any concerning symptoms in the interval. The patient voices understanding of current disease status and treatment options and is in agreement with the current care plan.  All questions were answered. The patient knows to call the clinic with any problems, questions  or concerns. We can certainly see the patient much sooner if necessary.  Thank you so much for allowing me to participate in the care of Yates. I will continue to follow up the patient with you and assist in her care.  Disclaimer: This note was dictated with voice recognition software. Similar sounding words can inadvertently be transcribed and may not be corrected upon review.   Irfan Veal K. September 08, 2015, 2:56 PM

## 2015-09-13 ENCOUNTER — Telehealth: Payer: Self-pay | Admitting: Internal Medicine

## 2015-09-13 NOTE — Telephone Encounter (Signed)
Called pt to conf appt. L/M. Appt ltr and schd mailed. 09/13/15

## 2015-09-17 ENCOUNTER — Encounter (HOSPITAL_COMMUNITY): Payer: Self-pay | Admitting: Emergency Medicine

## 2015-09-17 ENCOUNTER — Emergency Department (HOSPITAL_COMMUNITY)
Admission: EM | Admit: 2015-09-17 | Discharge: 2015-09-17 | Disposition: A | Discharge status: Home / Self Care | Payer: Medicare Other | Attending: Emergency Medicine | Admitting: Emergency Medicine

## 2015-09-17 ENCOUNTER — Emergency Department (HOSPITAL_COMMUNITY): Payer: Medicare Other

## 2015-09-17 DIAGNOSIS — Z79899 Other long term (current) drug therapy: Secondary | ICD-10-CM | POA: Diagnosis not present

## 2015-09-17 DIAGNOSIS — I10 Essential (primary) hypertension: Secondary | ICD-10-CM | POA: Diagnosis not present

## 2015-09-17 DIAGNOSIS — R1084 Generalized abdominal pain: Secondary | ICD-10-CM | POA: Diagnosis not present

## 2015-09-17 DIAGNOSIS — R112 Nausea with vomiting, unspecified: Secondary | ICD-10-CM | POA: Diagnosis present

## 2015-09-17 LAB — CBC WITH DIFFERENTIAL/PLATELET
Basophils Absolute: 0 10*3/uL (ref 0.0–0.1)
Basophils Relative: 0 %
Eosinophils Absolute: 0.1 10*3/uL (ref 0.0–0.7)
Eosinophils Relative: 1 %
HEMATOCRIT: 42.5 % (ref 36.0–46.0)
HEMOGLOBIN: 13.7 g/dL (ref 12.0–15.0)
LYMPHS ABS: 4.7 10*3/uL — AB (ref 0.7–4.0)
LYMPHS PCT: 50 %
MCH: 31.4 pg (ref 26.0–34.0)
MCHC: 32.2 g/dL (ref 30.0–36.0)
MCV: 97.3 fL (ref 78.0–100.0)
MONOS PCT: 6 %
Monocytes Absolute: 0.6 10*3/uL (ref 0.1–1.0)
NEUTROS PCT: 43 %
Neutro Abs: 4.1 10*3/uL (ref 1.7–7.7)
Platelets: 223 10*3/uL (ref 150–400)
RBC: 4.37 MIL/uL (ref 3.87–5.11)
RDW: 13.8 % (ref 11.5–15.5)
WBC: 9.5 10*3/uL (ref 4.0–10.5)

## 2015-09-17 LAB — URINE MICROSCOPIC-ADD ON

## 2015-09-17 LAB — URINALYSIS, ROUTINE W REFLEX MICROSCOPIC
Bilirubin Urine: NEGATIVE
GLUCOSE, UA: NEGATIVE mg/dL
KETONES UR: 15 mg/dL — AB
LEUKOCYTES UA: NEGATIVE
Nitrite: NEGATIVE
PH: 6.5 (ref 5.0–8.0)
Protein, ur: NEGATIVE mg/dL
Specific Gravity, Urine: 1.012 (ref 1.005–1.030)

## 2015-09-17 LAB — COMPREHENSIVE METABOLIC PANEL
ALK PHOS: 56 U/L (ref 38–126)
ALT: 23 U/L (ref 14–54)
AST: 25 U/L (ref 15–41)
Albumin: 4 g/dL (ref 3.5–5.0)
Anion gap: 14 (ref 5–15)
BILIRUBIN TOTAL: 0.8 mg/dL (ref 0.3–1.2)
BUN: 9 mg/dL (ref 6–20)
CALCIUM: 9.9 mg/dL (ref 8.9–10.3)
CO2: 20 mmol/L — AB (ref 22–32)
CREATININE: 0.92 mg/dL (ref 0.44–1.00)
Chloride: 103 mmol/L (ref 101–111)
Glucose, Bld: 124 mg/dL — ABNORMAL HIGH (ref 65–99)
Potassium: 3.6 mmol/L (ref 3.5–5.1)
Sodium: 137 mmol/L (ref 135–145)
TOTAL PROTEIN: 7.1 g/dL (ref 6.5–8.1)

## 2015-09-17 LAB — I-STAT CG4 LACTIC ACID, ED: LACTIC ACID, VENOUS: 3.8 mmol/L — AB (ref 0.5–1.9)

## 2015-09-17 LAB — LIPASE, BLOOD: LIPASE: 14 U/L (ref 11–51)

## 2015-09-17 LAB — POC URINE PREG, ED: Preg Test, Ur: NEGATIVE

## 2015-09-17 MED ORDER — SODIUM CHLORIDE 0.9 % IV BOLUS (SEPSIS)
2000.0000 mL | Freq: Once | INTRAVENOUS | Status: AC
Start: 2015-09-17 — End: 2015-09-17
  Administered 2015-09-17: 2000 mL via INTRAVENOUS

## 2015-09-17 MED ORDER — MORPHINE SULFATE (PF) 4 MG/ML IV SOLN
6.0000 mg | Freq: Once | INTRAVENOUS | Status: AC
  Administered 2015-09-17: 6 mg via INTRAVENOUS
  Filled 2015-09-17: qty 2

## 2015-09-17 MED ORDER — IOPAMIDOL (ISOVUE-300) INJECTION 61%
INTRAVENOUS | Status: AC
  Administered 2015-09-17: 100 mL
  Filled 2015-09-17: qty 100

## 2015-09-17 MED ORDER — PROMETHAZINE HCL 25 MG PO TABS: 25.0000 mg | ORAL_TABLET | Freq: Two times a day (BID) | ORAL | 0 refills | Status: DC | PRN

## 2015-09-17 MED ORDER — PROCHLORPERAZINE EDISYLATE 5 MG/ML IJ SOLN
10.0000 mg | Freq: Once | INTRAMUSCULAR | Status: AC
  Administered 2015-09-17: 10 mg via INTRAVENOUS
  Filled 2015-09-17: qty 2

## 2015-09-17 MED ORDER — DIPHENHYDRAMINE HCL 50 MG/ML IJ SOLN
50.0000 mg | Freq: Once | INTRAMUSCULAR | Status: AC
  Administered 2015-09-17: 50 mg via INTRAVENOUS
  Filled 2015-09-17: qty 1

## 2015-09-17 NOTE — ED Triage Notes (Signed)
Per pt, c/o n/v x 1 day. Denies diarrhea, or abdominal pain. Pt states she has been taking phenegran without relief.

## 2015-09-17 NOTE — ED Provider Notes (Signed)
[facility] DEPT Provider Note   CSN: [id number] Arrival date & time: 09/17/15  0420     History   Chief Complaint Chief Complaint  Patient presents with  . Emesis    HPI KAMEREN REPINSKI is a 55 y.o. female past medical history of left renal vein thrombus presents today with abdominal pain. Patient states this began with sudden onset tonight. She describes some nausea and vomiting as well. There has been no diarrhea. There's been no fevers or sick contacts. She states this pain is similar to when she was diagnosed with the blood clot. She has not received any follow-up for that. She has no urinary symptoms, no vaginal complaints. There are no further complaints.  10 Systems reviewed and are negative for acute change except as noted in the HPI.    HPI  Past Medical History:  Diagnosis Date  . Cancer ([place])   . Hypercholesteremia   . Hypertension   . Thrombosis of left renal vein ([place]) 09/08/2015    Patient Active Problem List   Diagnosis Date Noted  . 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 S/P hysterectomy 10/01/2014    Past Surgical History:  Procedure Laterality Date  . ABDOMINAL HYSTERECTOMY    . ABDOMINAL SURGERY      OB History    No data available       Home Medications    Prior to Admission medications   Medication Sig Start Date End Date Taking? Authorizing Provider  amitriptyline (ELAVIL) 25 MG tablet Take 25 mg by mouth at bedtime. 07/22/15  Yes Melburn Popper, MD  baclofen (LIORESAL) 10 MG tablet Take 10 mg by mouth 2 (two) times daily.   Yes Historical Provider, MD  HYDROcodone-acetaminophen (NORCO) 10-325 MG per tablet Take 1-5 tablets by mouth See admin instructions. Take up to 5 tablets every day   Yes Historical Provider, MD  lisinopril (PRINIVIL,ZESTRIL) 20 MG tablet Take 20 mg by mouth daily. 09/14/15  Yes Historical Provider, MD  omeprazole (PRILOSEC) 20 MG capsule Take 20 mg by mouth 2 (two) times daily.    Yes Historical  Provider, MD  promethazine (PHENERGAN) 25 MG tablet Take 1 tablet (25 mg total) by mouth every 6 (six) hours as needed for nausea or vomiting. 05/01/15  Yes Drenda Freeze, MD  rosuvastatin (CRESTOR) 10 MG tablet Take 10 mg by mouth daily.   Yes Historical Provider, MD    Family History No family history on file.  Social History Social History  Substance Use Topics  . Smoking status: Never Smoker  . Smokeless tobacco: Never Used  . Alcohol use No     Allergies   Percocet [oxycodone-acetaminophen]; Tramadol; and Vistaril [hydroxyzine hcl]   Review of Systems Review of Systems   Physical Exam Updated Vital Signs BP 126/94 (BP Location: Right Arm)   Pulse 99   Temp 99.6 F (37.6 C) (Oral)   Resp 13   SpO2 100%   Physical Exam  Constitutional: She is oriented to person, place, and time. She appears well-developed and well-nourished. She appears distressed.  HENT:  Head: Normocephalic and atraumatic.  Nose: Nose normal.  Mouth/Throat: Oropharynx is clear and moist. No oropharyngeal exudate.  Eyes: Conjunctivae and EOM are normal. Pupils are equal, round, and reactive to light. No scleral icterus.  Neck: Normal range of motion. Neck supple. No JVD present. No tracheal deviation present. No thyromegaly present.  Cardiovascular: Normal rate, regular rhythm and normal heart sounds.  Exam reveals no gallop  and no friction rub.   No murmur heard. Pulmonary/Chest: Effort normal and breath sounds normal. No respiratory distress. She has no wheezes. She exhibits no tenderness.  Abdominal: Soft. Bowel sounds are normal. She exhibits no distension and no mass. There is tenderness. There is no rebound and no guarding.  Diffuse abdominal tenderness to palpation.  Musculoskeletal: Normal range of motion. She exhibits no edema or tenderness.  Lymphadenopathy:    She has no cervical adenopathy.  Neurological: She is alert and oriented to person, place, and time. No cranial nerve  deficit. She exhibits normal muscle tone.  Skin: Skin is warm and dry. No rash noted. No erythema. No pallor.  Nursing note and vitals reviewed.    ED Treatments / Results  Labs (all labs ordered are listed, but only abnormal results are displayed) Labs Reviewed  CBC WITH DIFFERENTIAL/PLATELET - Abnormal; Notable for the following:       Result Value   Lymphs Abs 4.7 (*)    All other components within normal limits  COMPREHENSIVE METABOLIC PANEL - Abnormal; Notable for the following:    CO2 20 (*)    Glucose, Bld 124 (*)    All other components within normal limits  URINALYSIS, ROUTINE W REFLEX MICROSCOPIC (NOT AT [facility]) - Abnormal; Notable for the following:    Hgb urine dipstick TRACE (*)    Ketones, ur 15 (*)    All other components within normal limits  URINE MICROSCOPIC-ADD ON - Abnormal; Notable for the following:    Squamous Epithelial / LPF 0-5 (*)    Bacteria, UA RARE (*)    Casts HYALINE CASTS (*)    All other components within normal limits  I-STAT CG4 LACTIC ACID, ED - Abnormal; Notable for the following:    Lactic Acid, Venous 3.80 (*)    All other components within normal limits  URINE CULTURE  LIPASE, BLOOD  POC URINE PREG, ED    EKG  EKG Interpretation None       Radiology Ct Abdomen Pelvis W Contrast  Result Date: 09/17/2015 CLINICAL DATA:  Right-sided abdominal pain. Nausea and vomiting for 2 days. History of renal vein thrombosis. EXAM: CT ABDOMEN AND PELVIS WITH CONTRAST TECHNIQUE: Multidetector CT imaging of the abdomen and pelvis was performed using the standard protocol following bolus administration of intravenous contrast. CONTRAST:  180mL ISOVUE-300 IOPAMIDOL (ISOVUE-300) INJECTION 61% COMPARISON:  CT 05/01/2015 FINDINGS: Lower chest: Linear atelectasis in the right lower lobe. No pleural fluid. Liver: No focal lesion. Hepatobiliary: Clips in the gallbladder fossa postcholecystectomy. No biliary dilatation. Pancreas: No ductal dilatation or  inflammation. Spleen: Normal. Adrenal glands: No nodule. Kidneys: Probable nonobstructing 3 mm stone in the mid lower right kidney. Probable punctate nonobstructing stone in the upper left kidney. Symmetric renal enhancement. No hydronephrosis. Symmetric excretion on delayed phase imaging. The previously described left renal vein thrombosis is no longer seen. Stomach/Bowel: Stomach physiologically distended. Small hiatal hernia. There are no dilated or thickened small bowel loops. No bowel obstruction. Small volume of stool throughout the colon without colonic wall thickening. A few scattered colonic diverticula. The appendix is surgically absent. Vascular/Lymphatic: No retroperitoneal adenopathy. Abdominal aorta is normal in caliber. Atherosclerosis of the abdominal aorta without aneurysm. Previous left renal vein thrombosis has resolved. Reproductive: Post hysterectomy. Unchanged appearance of the vagina with small focus of air in the wall thickening. The left ovary remains in situ. The right ovary is not confidently identified. Bladder: Decompressed and not well evaluated. Other: No free air, free fluid, or  intra-abdominal fluid collection. Tiny fat containing umbilical hernia. Musculoskeletal: There are no acute or suspicious osseous abnormalities. Degenerative disc disease at L5-S1. IMPRESSION: 1. Bilateral nonobstructing renal calculi. No hydronephrosis. Previous left renal vein thrombosis is no longer seen and has likely resolved. 2. No acute abnormality in the abdomen/pelvis. 3. Incidental mild colonic diverticulosis, small hiatal hernia and tiny fat containing umbilical hernia. Electronically Signed   By: Jeb Levering M.D.   On: 09/17/2015 06:59    Procedures Procedures (including critical care time)  Medications Ordered in ED Medications  sodium chloride 0.9 % bolus 2,000 mL (2,000 mLs Intravenous New Bag/Given 09/17/15 0523)  morphine 4 MG/ML injection 6 mg (6 mg Intravenous Given 09/17/15  0522)  prochlorperazine (COMPAZINE) injection 10 mg (10 mg Intravenous Given 09/17/15 0522)  diphenhydrAMINE (BENADRYL) injection 50 mg (50 mg Intravenous Given 09/17/15 0523)  iopamidol (ISOVUE-300) 61 % injection (100 mLs  Contrast Given 09/17/15 0615)     Initial Impression / Assessment and Plan / ED Course  I have reviewed the triage vital signs and the nursing notes.  Pertinent labs & imaging results that were available during my care of the patient were reviewed by me and considered in my medical decision making (see chart for details).  Clinical Course    Patient presents to the emergency department for abdominal pain. She was initially distressed with diffuse pain. She was given Dilaudid, Compazine, Benadryl which worked for her last time. Will obtain CT scan abdomen and pelvis for further evaluation. Laboratory studies are unremarkable.   7:12 AM CT does not reveal any acute cause for her pain. Left renal thrombus has resolved spontaneously.  She was advised to follow with her primary care physician. She is requesting to have her Phenergan refilled, this is provided for her. She appears well in no acute distress, vital signs were within her normal limits and she is safe for discharge. Final Clinical Impressions(s) / ED Diagnoses   Final diagnoses:  None    New Prescriptions New Prescriptions   No medications on file     Everlene Balls, MD 09/17/15 [phone]

## 2015-09-18 LAB — URINE CULTURE

## 2015-09-22 ENCOUNTER — Telehealth: Payer: Self-pay | Admitting: Medical Oncology

## 2015-09-22 NOTE — Telephone Encounter (Signed)
Do I need repeat Ct scan-I just had one. I cancelled scan and left message for pt with this information.

## 2015-10-05 ENCOUNTER — Encounter (HOSPITAL_COMMUNITY): Payer: Self-pay | Admitting: Emergency Medicine

## 2015-10-05 ENCOUNTER — Emergency Department (HOSPITAL_COMMUNITY)
Admission: EM | Admit: 2015-10-05 | Discharge: 2015-10-05 | Disposition: A | Discharge status: Home / Self Care | Payer: Medicare Other | Attending: Emergency Medicine | Admitting: Emergency Medicine

## 2015-10-05 DIAGNOSIS — R112 Nausea with vomiting, unspecified: Secondary | ICD-10-CM

## 2015-10-05 DIAGNOSIS — Z79899 Other long term (current) drug therapy: Secondary | ICD-10-CM | POA: Insufficient documentation

## 2015-10-05 DIAGNOSIS — R519 Headache, unspecified: Secondary | ICD-10-CM

## 2015-10-05 DIAGNOSIS — Z8541 Personal history of malignant neoplasm of cervix uteri: Secondary | ICD-10-CM | POA: Diagnosis not present

## 2015-10-05 DIAGNOSIS — R1084 Generalized abdominal pain: Secondary | ICD-10-CM

## 2015-10-05 DIAGNOSIS — R51 Headache: Secondary | ICD-10-CM | POA: Diagnosis not present

## 2015-10-05 DIAGNOSIS — I10 Essential (primary) hypertension: Secondary | ICD-10-CM | POA: Insufficient documentation

## 2015-10-05 DIAGNOSIS — R109 Unspecified abdominal pain: Secondary | ICD-10-CM | POA: Diagnosis present

## 2015-10-05 LAB — COMPREHENSIVE METABOLIC PANEL
ALBUMIN: 4.6 g/dL (ref 3.5–5.0)
ALK PHOS: 53 U/L (ref 38–126)
ALT: 18 U/L (ref 14–54)
ANION GAP: 14 (ref 5–15)
AST: 23 U/L (ref 15–41)
BUN: 9 mg/dL (ref 6–20)
CHLORIDE: 105 mmol/L (ref 101–111)
CO2: 21 mmol/L — AB (ref 22–32)
Calcium: 10.5 mg/dL — ABNORMAL HIGH (ref 8.9–10.3)
Creatinine, Ser: 0.98 mg/dL (ref 0.44–1.00)
GFR calc Af Amer: >60 mL/min (ref >60)
GFR calc non Af Amer: >60 mL/min (ref >60)
GLUCOSE: 122 mg/dL — AB (ref 65–99)
POTASSIUM: 3 mmol/L — AB (ref 3.5–5.1)
SODIUM: 140 mmol/L (ref 135–145)
Total Bilirubin: 0.2 mg/dL — ABNORMAL LOW (ref 0.3–1.2)
Total Protein: 7.8 g/dL (ref 6.5–8.1)

## 2015-10-05 LAB — URINALYSIS, ROUTINE W REFLEX MICROSCOPIC
BILIRUBIN URINE: NEGATIVE
Glucose, UA: NEGATIVE mg/dL
Ketones, ur: >80 mg/dL — AB
Leukocytes, UA: NEGATIVE
Nitrite: NEGATIVE
PH: 6 (ref 5.0–8.0)
Protein, ur: NEGATIVE mg/dL
SPECIFIC GRAVITY, URINE: 1.02 (ref 1.005–1.030)

## 2015-10-05 LAB — URINE MICROSCOPIC-ADD ON

## 2015-10-05 LAB — CBC
HEMATOCRIT: 44.1 % (ref 36.0–46.0)
HEMOGLOBIN: 14.5 g/dL (ref 12.0–15.0)
MCH: 31.5 pg (ref 26.0–34.0)
MCHC: 32.9 g/dL (ref 30.0–36.0)
MCV: 95.7 fL (ref 78.0–100.0)
Platelets: 283 10*3/uL (ref 150–400)
RBC: 4.61 MIL/uL (ref 3.87–5.11)
RDW: 13.3 % (ref 11.5–15.5)
WBC: 10.3 10*3/uL (ref 4.0–10.5)

## 2015-10-05 LAB — LIPASE, BLOOD: LIPASE: 19 U/L (ref 11–51)

## 2015-10-05 MED ORDER — PROCHLORPERAZINE EDISYLATE 5 MG/ML IJ SOLN
10.0000 mg | Freq: Once | INTRAMUSCULAR | Status: AC
  Administered 2015-10-05: 10 mg via INTRAVENOUS
  Filled 2015-10-05: qty 2

## 2015-10-05 MED ORDER — HYDROMORPHONE HCL 1 MG/ML IJ SOLN
1.0000 mg | Freq: Once | INTRAMUSCULAR | Status: AC
  Administered 2015-10-05: 1 mg via INTRAVENOUS
  Filled 2015-10-05: qty 1

## 2015-10-05 MED ORDER — KETOROLAC TROMETHAMINE 30 MG/ML IJ SOLN
30.0000 mg | Freq: Once | INTRAMUSCULAR | Status: AC
  Administered 2015-10-05: 30 mg via INTRAVENOUS
  Filled 2015-10-05: qty 1

## 2015-10-05 MED ORDER — POTASSIUM CHLORIDE CRYS ER 20 MEQ PO TBCR
40.0000 meq | EXTENDED_RELEASE_TABLET | Freq: Once | ORAL | Status: AC
  Administered 2015-10-05: 40 meq via ORAL
  Filled 2015-10-05: qty 2

## 2015-10-05 MED ORDER — DIPHENHYDRAMINE HCL 50 MG/ML IJ SOLN
50.0000 mg | Freq: Once | INTRAMUSCULAR | Status: AC
  Administered 2015-10-05: 50 mg via INTRAVENOUS
  Filled 2015-10-05: qty 1

## 2015-10-05 MED ORDER — SODIUM CHLORIDE 0.9 % IV BOLUS (SEPSIS)
2000.0000 mL | Freq: Once | INTRAVENOUS | Status: AC
  Administered 2015-10-05: 2000 mL via INTRAVENOUS

## 2015-10-05 MED ORDER — PROMETHAZINE HCL 25 MG PO TABS: 25.0000 mg | ORAL_TABLET | Freq: Two times a day (BID) | ORAL | 0 refills | Status: DC

## 2015-10-05 MED ORDER — POTASSIUM CHLORIDE 10 MEQ/100ML IV SOLN: 10.0000 meq | Freq: Once | INTRAVENOUS | Status: DC

## 2015-10-05 MED ORDER — SODIUM CHLORIDE 0.9 % IV BOLUS (SEPSIS): 1000.0000 mL | Freq: Once | INTRAVENOUS | Status: DC

## 2015-10-05 NOTE — ED Provider Notes (Signed)
[facility] DEPT Provider Note   CSN: [id number] Arrival date & time: 10/05/15  [id number]     History   Chief Complaint Chief Complaint  Patient presents with  . Abdominal Pain    HPI Katrina Vasquez is a 55 y.o. female who presents with abdominal pain, HA, N/V. PMH significant for several presentations of the same, hx of left renal vein thrombosis, hx of SBO, cervical cancer, HTN, HLD. She has had multiple abdominal surgeries including cholecystectomy, appendectomy, total hysterectomy with bilateral oophorectomy. She started having N/V 3 days ago. She has been having associated right sided abdominal pain as well. It is constant and does not radiate. She is having a HA as well. She states she was seen a week and a half ago for the same problem and her symptoms were exactly the same. CT of abdomen and pelvis at that time showed resolution of L renal vein thrombosis but did not show an acute abnormality to explain her symptoms. Denies fever, chills, neck pain, vision changes, syncope.  HPI  Past Medical History:  Diagnosis Date  . Cancer ([place])   . Hypercholesteremia   . Hypertension   . Thrombosis of left renal vein ([place]) 09/08/2015    Patient Active Problem List   Diagnosis Date Noted  . 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 S/P hysterectomy 10/01/2014    Past Surgical History:  Procedure Laterality Date  . ABDOMINAL HYSTERECTOMY    . ABDOMINAL SURGERY      OB History    No data available       Home Medications    Prior to Admission medications   Medication Sig Start Date End Date Taking? Authorizing Provider  amitriptyline (ELAVIL) 25 MG tablet Take 25 mg by mouth at bedtime. 07/22/15  Yes Melburn Popper, MD  baclofen (LIORESAL) 10 MG tablet Take 10 mg by mouth 2 (two) times daily.   Yes Historical Provider, MD  HYDROcodone-acetaminophen (NORCO) 10-325 MG per tablet Take 1-5 tablets by mouth See admin instructions. Take up to 5 tablets every day   Yes  Historical Provider, MD  lisinopril (PRINIVIL,ZESTRIL) 20 MG tablet Take 20 mg by mouth daily. 09/14/15  Yes Historical Provider, MD  omeprazole (PRILOSEC) 20 MG capsule Take 20 mg by mouth 2 (two) times daily.    Yes Historical Provider, MD  promethazine (PHENERGAN) 25 MG tablet Take 1 tablet (25 mg total) by mouth 2 (two) times daily as needed for nausea or vomiting. 09/17/15  Yes Everlene Balls, MD  rosuvastatin (CRESTOR) 10 MG tablet Take 10 mg by mouth daily.   Yes Historical Provider, MD    Family History History reviewed. No pertinent family history.  Social History Social History  Substance Use Topics  . Smoking status: Never Smoker  . Smokeless tobacco: Never Used  . Alcohol use No     Allergies   Percocet [oxycodone-acetaminophen]; Tramadol; and Vistaril [hydroxyzine hcl]   Review of Systems Review of Systems  Constitutional: Negative for chills and fever.  Eyes: Negative for visual disturbance.  Respiratory: Negative for shortness of breath.   Cardiovascular: Negative for chest pain.  Gastrointestinal: Positive for abdominal pain, nausea and vomiting. Negative for diarrhea.  Genitourinary: Negative for dysuria, vaginal bleeding and vaginal discharge.  Neurological: Positive for headaches. Negative for syncope.  All other systems reviewed and are negative.    Physical Exam Updated Vital Signs BP 107/65   Pulse 107   Temp 98.5 F (36.9 C) (Oral)   Resp 18  Ht 5\' 3"  (1.6 m)   Wt 63.5 kg   SpO2 99%   BMI 24.80 kg/m    Physical Exam  Constitutional: She is oriented to person, place, and time. She appears well-developed and well-nourished. She appears distressed.  Tearful  HENT:  Head: Normocephalic and atraumatic.  Dry mucous membranes  Eyes: Conjunctivae and EOM are normal. Pupils are equal, round, and reactive to light. Right eye exhibits no discharge. Left eye exhibits no discharge. No scleral icterus.  Neck: Normal range of motion. Neck supple.    Cardiovascular: Normal rate and regular rhythm.  Exam reveals no gallop and no friction rub.   No murmur heard. Pulmonary/Chest: Effort normal and breath sounds normal. No respiratory distress. She has no wheezes. She has no rales. She exhibits no tenderness.  Abdominal: Soft. Bowel sounds are normal. She exhibits no distension and no mass. There is tenderness. There is no rebound and no guarding. No hernia.  Mild diffuse tenderness; Midline surgical scar noted  Musculoskeletal: She exhibits no edema.  Neurological: She is alert and oriented to person, place, and time. No cranial nerve deficit. Coordination normal.  Moves all 4 extremities freely  Skin: Skin is warm and dry.  Psychiatric: She has a normal mood and affect. Her behavior is normal.  Nursing note and vitals reviewed.    ED Treatments / Results  Labs (all labs ordered are listed, but only abnormal results are displayed) Labs Reviewed  COMPREHENSIVE METABOLIC PANEL - Abnormal; Notable for the following:       Result Value   Potassium 3.0 (*)    CO2 21 (*)    Glucose, Bld 122 (*)    Calcium 10.5 (*)    Total Bilirubin 0.2 (*)    All other components within normal limits  LIPASE, BLOOD  CBC  URINALYSIS, ROUTINE W REFLEX MICROSCOPIC (NOT AT [facility])    EKG  EKG Interpretation  Date/Time:  Tuesday October 05 2015 09:22:52 EDT Ventricular Rate:  127 PR Interval:  122 QRS Duration: 58 QT Interval:  272 QTC Calculation: 395 R Axis:   53 Text Interpretation:  Sinus tachycardia Septal infarct , age undetermined Abnormal ECG tachycardia new from previous Confirmed by LITTLE MD, RACHEL [id number]) on 10/05/2015 12:28:32 PM       Radiology No results found.  Procedures Procedures (including critical care time)  Medications Ordered in ED Medications  ketorolac (TORADOL) 30 MG/ML injection 30 mg (not administered)  HYDROmorphone (DILAUDID) injection 1 mg (1 mg Intravenous Given 10/05/15 1157)  prochlorperazine  (COMPAZINE) injection 10 mg (10 mg Intravenous Given 10/05/15 1157)  diphenhydrAMINE (BENADRYL) injection 50 mg (50 mg Intravenous Given 10/05/15 1158)  sodium chloride 0.9 % bolus 2,000 mL (2,000 mLs Intravenous New Bag/Given 10/05/15 1158)  potassium chloride SA (K-DUR,KLOR-CON) CR tablet 40 mEq (40 mEq Oral Given 10/05/15 1250)     Initial Impression / Assessment and Plan / ED Course  I have reviewed the triage vital signs and the nursing notes.  Pertinent labs & imaging results that were available during my care of the patient were reviewed by me and considered in my medical decision making (see chart for details).  Clinical Course   55 year old female presents with non-intractable N/V, HA, and abdominal pain. She states her symptoms are exactly the same as 2.5 weeks ago. Do not feel imaging is indicated with benign abdominal exam and no focal deficits. She does show signs of dehydration on exam. Treated with 2 L IVF, compazine, benadryl, and dilaudid.  CBC unremarkable. CMP remarkable for K of 3.0, CO2 of 21, Ca of 10.5. UA remarkable for rare bacteria, >80 ketones, small hgb. May consider UDS if patient represents.   On recheck, patient states she is feeling better. Still has a HA. Will add toradol. Klor con given for low potassium likely from emesis.   Patient reports symptoms have completely resolved and is ready for d/c. Tachycardia has improved. She is requesting Phenergan which she was prescribed 2 weeks ago and states she has run out. Will give 15 day supply and have her follow up with PCP. Patient is NAD, non-toxic, with stable VS. Patient is informed of clinical course, understands medical decision making process, and agrees with plan. Opportunity for questions provided and all questions answered. Return precautions given.   Final Clinical Impressions(s) / ED Diagnoses   Final diagnoses:  Generalized abdominal pain  Non-intractable vomiting with nausea, vomiting of unspecified type    Nonintractable headache, unspecified chronicity pattern, unspecified headache type    New Prescriptions Discharge Medication List as of 10/05/2015  3:00 PM       Recardo Evangelist, PA-C 10/05/15 [place], MD 10/06/15 [phone]

## 2015-10-05 NOTE — ED Triage Notes (Signed)
Pt complaining of HA, N/V, right abd pain. Chest pain when she vomits. Symptoms have been going on for 3 days. Pt states she feels dizzy and lightheaded

## 2015-10-05 NOTE — ED Notes (Signed)
RN attempt IV. Unable to establish.

## 2015-10-05 NOTE — ED Notes (Signed)
Ambulated patient to the bathroom.  Tolerated well.

## 2015-10-05 NOTE — ED Notes (Signed)
IV team at bedside 

## 2015-10-11 ENCOUNTER — Other Ambulatory Visit: Payer: Self-pay

## 2015-10-11 ENCOUNTER — Ambulatory Visit (HOSPITAL_BASED_OUTPATIENT_CLINIC_OR_DEPARTMENT_OTHER): Payer: Medicare Other | Admitting: Internal Medicine

## 2015-10-11 ENCOUNTER — Encounter: Payer: Self-pay | Admitting: Internal Medicine

## 2015-10-11 VITALS — BP 155/94 | HR 136 | Temp 98.6°F | Resp 18 | Ht 63.0 in | Wt 150.6 lb

## 2015-10-11 DIAGNOSIS — Z86718 Personal history of other venous thrombosis and embolism: Secondary | ICD-10-CM

## 2015-10-11 DIAGNOSIS — R000 Tachycardia, unspecified: Secondary | ICD-10-CM

## 2015-10-11 DIAGNOSIS — I10 Essential (primary) hypertension: Secondary | ICD-10-CM

## 2015-10-11 DIAGNOSIS — I823 Embolism and thrombosis of renal vein: Secondary | ICD-10-CM

## 2015-10-11 NOTE — Progress Notes (Signed)
[facility] Telephone:([phone]) [phone]   Fax:([phone]) [place], [place] [address]  DIAGNOSIS: Left renal vein thrombosis, resolved  PRIOR THERAPY: None  CURRENT THERAPY: None  INTERVAL HISTORY: Katrina Vasquez 55 y.o. female returns to the clinic today for follow-up visit. The patient is feeling fine today with no specific complaints except for fatigue and tachycardia. She was sick recently and was unable to drink and eat enough. She denied having any significant chest pain, shortness breath, cough or hemoptysis. She has no nausea, vomiting, diarrhea or constipation. She was found on previous imaging studies to have left renal vein thrombosis. This was felt to be secondary to abdominal adhesions. The patient did not have any hypercoagulable abnormality at that time except for slightly elevated homocysteine level. She was treated with folic acid. She had repeat CT of the abdomen and pelvis recently and she is here for evaluation and discussion of her scan results.  MEDICAL HISTORY: Past Medical History:  Diagnosis Date  . Cancer ([place])   . Hypercholesteremia   . Hypertension   . Thrombosis of left renal vein ([place]) 09/08/2015    ALLERGIES:  is allergic to percocet [oxycodone-acetaminophen]; tramadol; and vistaril [hydroxyzine hcl].  MEDICATIONS:  Current Outpatient Prescriptions  Medication Sig Dispense Refill  . amitriptyline (ELAVIL) 25 MG tablet Take 25 mg by mouth at bedtime.  3  . baclofen (LIORESAL) 10 MG tablet Take 10 mg by mouth 2 (two) times daily.    Marland Kitchen HYDROcodone-acetaminophen (NORCO) 10-325 MG per tablet Take 1-5 tablets by mouth See admin instructions. Take up to 5 tablets every day    . HYSINGLA ER 30 MG T24A     . lisinopril (PRINIVIL,ZESTRIL) 20 MG tablet Take 20 mg by mouth daily.    Marland Kitchen omeprazole (PRILOSEC) 20 MG capsule Take 20 mg by mouth 2 (two) times daily.     . promethazine  (PHENERGAN) 25 MG tablet Take 1 tablet (25 mg total) by mouth 2 (two) times daily. 30 tablet 0  . rosuvastatin (CRESTOR) 10 MG tablet Take 10 mg by mouth daily.     No current facility-administered medications for this visit.     SURGICAL HISTORY:  Past Surgical History:  Procedure Laterality Date  . ABDOMINAL HYSTERECTOMY    . ABDOMINAL SURGERY      REVIEW OF SYSTEMS:  A comprehensive review of systems was negative except for: Constitutional: positive for fatigue   PHYSICAL EXAMINATION: General appearance: alert, cooperative, fatigued and no distress Head: Normocephalic, without obvious abnormality, atraumatic Neck: no adenopathy, no JVD, supple, symmetrical, trachea midline and thyroid not enlarged, symmetric, no tenderness/mass/nodules Lymph nodes: Cervical, supraclavicular, and axillary nodes normal. Resp: clear to auscultation bilaterally Back: symmetric, no curvature. ROM normal. No CVA tenderness. Cardio: regular rate and rhythm, S1, S2 normal, no murmur, click, rub or gallop GI: soft, non-tender; bowel sounds normal; no masses,  no organomegaly Extremities: extremities normal, atraumatic, no cyanosis or edema  ECOG PERFORMANCE STATUS: 1 - Symptomatic but completely ambulatory  Blood pressure (!) 155/94, pulse (!) 136, temperature 98.6 F (37 C), temperature source Oral, resp. rate 18, height 5\' 3"  (1.6 m), weight 150 lb 9.6 oz (68.3 kg), SpO2 99 %.  LABORATORY DATA: Lab Results  Component Value Date   WBC 10.3 10/05/2015   HGB 14.5 10/05/2015   HCT 44.1 10/05/2015   MCV 95.7 10/05/2015   PLT 283 10/05/2015  Chemistry      Component Value Date/Time   NA 140 10/05/2015 0937   K 3.0 (L) 10/05/2015 0937   CL 105 10/05/2015 0937   CO2 21 (L) 10/05/2015 0937   BUN 9 10/05/2015 0937   CREATININE 0.98 10/05/2015 0937      Component Value Date/Time   CALCIUM 10.5 (H) 10/05/2015 0937   ALKPHOS 53 10/05/2015 0937   AST 23 10/05/2015 0937   ALT 18 10/05/2015 0937    BILITOT 0.2 (L) 10/05/2015 0937       RADIOGRAPHIC STUDIES: Ct Abdomen Pelvis W Contrast  Result Date: 09/17/2015 CLINICAL DATA:  Right-sided abdominal pain. Nausea and vomiting for 2 days. History of renal vein thrombosis. EXAM: CT ABDOMEN AND PELVIS WITH CONTRAST TECHNIQUE: Multidetector CT imaging of the abdomen and pelvis was performed using the standard protocol following bolus administration of intravenous contrast. CONTRAST:  141mL ISOVUE-300 IOPAMIDOL (ISOVUE-300) INJECTION 61% COMPARISON:  CT 05/01/2015 FINDINGS: Lower chest: Linear atelectasis in the right lower lobe. No pleural fluid. Liver: No focal lesion. Hepatobiliary: Clips in the gallbladder fossa postcholecystectomy. No biliary dilatation. Pancreas: No ductal dilatation or inflammation. Spleen: Normal. Adrenal glands: No nodule. Kidneys: Probable nonobstructing 3 mm stone in the mid lower right kidney. Probable punctate nonobstructing stone in the upper left kidney. Symmetric renal enhancement. No hydronephrosis. Symmetric excretion on delayed phase imaging. The previously described left renal vein thrombosis is no longer seen. Stomach/Bowel: Stomach physiologically distended. Small hiatal hernia. There are no dilated or thickened small bowel loops. No bowel obstruction. Small volume of stool throughout the colon without colonic wall thickening. A few scattered colonic diverticula. The appendix is surgically absent. Vascular/Lymphatic: No retroperitoneal adenopathy. Abdominal aorta is normal in caliber. Atherosclerosis of the abdominal aorta without aneurysm. Previous left renal vein thrombosis has resolved. Reproductive: Post hysterectomy. Unchanged appearance of the vagina with small focus of air in the wall thickening. The left ovary remains in situ. The right ovary is not confidently identified. Bladder: Decompressed and not well evaluated. Other: No free air, free fluid, or intra-abdominal fluid collection. Tiny fat containing  umbilical hernia. Musculoskeletal: There are no acute or suspicious osseous abnormalities. Degenerative disc disease at L5-S1. IMPRESSION: 1. Bilateral nonobstructing renal calculi. No hydronephrosis. Previous left renal vein thrombosis is no longer seen and has likely resolved. 2. No acute abnormality in the abdomen/pelvis. 3. Incidental mild colonic diverticulosis, small hiatal hernia and tiny fat containing umbilical hernia. Electronically Signed   By: Jeb Levering M.D.   On: 09/17/2015 06:59    ASSESSMENT AND PLAN: This is a very pleasant 55 years old African-American female with previous left renal vein thrombosis that is completely resolved at this point. The patient did not receive any anticoagulation. This could be secondary to abdominal adhesions. I discussed the scan results with the patient today. I recommended for her to continue on observation with routine follow-up visit by her primary care physician. For the tachycardia, I will order EKG to rule out any abnormality. She was also advised to continue her routine visit and evaluation by her primary care physician for the hypertension and tachycardia. I don't see a need to continue seeing the patient at regular basis but I'll be happy to see her in the future if needed. The patient voices understanding of current disease status and treatment options and is in agreement with the current care plan.  All questions were answered. The patient knows to call the clinic with any problems, questions or concerns. We can certainly see the  patient much sooner if necessary.  Disclaimer: This note was dictated with voice recognition software. Similar sounding words can inadvertently be transcribed and may not be corrected upon review.

## 2015-10-28 ENCOUNTER — Other Ambulatory Visit: Payer: Self-pay | Admitting: Internal Medicine

## 2015-10-28 DIAGNOSIS — E2839 Other primary ovarian failure: Secondary | ICD-10-CM

## 2016-03-10 ENCOUNTER — Encounter (HOSPITAL_COMMUNITY): Payer: Self-pay | Admitting: Emergency Medicine

## 2016-03-10 ENCOUNTER — Emergency Department (HOSPITAL_COMMUNITY)
Admission: EM | Admit: 2016-03-10 | Discharge: 2016-03-10 | Disposition: A | Discharge status: Home / Self Care | Payer: Medicare Other | Attending: Emergency Medicine | Admitting: Emergency Medicine

## 2016-03-10 ENCOUNTER — Emergency Department (HOSPITAL_COMMUNITY): Payer: Medicare Other

## 2016-03-10 DIAGNOSIS — R112 Nausea with vomiting, unspecified: Secondary | ICD-10-CM | POA: Diagnosis present

## 2016-03-10 DIAGNOSIS — Z79899 Other long term (current) drug therapy: Secondary | ICD-10-CM | POA: Diagnosis not present

## 2016-03-10 DIAGNOSIS — I10 Essential (primary) hypertension: Secondary | ICD-10-CM | POA: Insufficient documentation

## 2016-03-10 DIAGNOSIS — R1013 Epigastric pain: Secondary | ICD-10-CM | POA: Insufficient documentation

## 2016-03-10 LAB — URINALYSIS, ROUTINE W REFLEX MICROSCOPIC
Bilirubin Urine: NEGATIVE
GLUCOSE, UA: NEGATIVE mg/dL
HGB URINE DIPSTICK: NEGATIVE
Ketones, ur: NEGATIVE mg/dL
Leukocytes, UA: NEGATIVE
Nitrite: NEGATIVE
PROTEIN: NEGATIVE mg/dL
Specific Gravity, Urine: >1.046 — ABNORMAL HIGH (ref 1.005–1.030)
pH: 7 (ref 5.0–8.0)

## 2016-03-10 LAB — COMPREHENSIVE METABOLIC PANEL
ALBUMIN: 3.6 g/dL (ref 3.5–5.0)
ALT: 30 U/L (ref 14–54)
AST: 22 U/L (ref 15–41)
Alkaline Phosphatase: 60 U/L (ref 38–126)
Anion gap: 12 (ref 5–15)
BUN: 23 mg/dL — AB (ref 6–20)
CHLORIDE: 103 mmol/L (ref 101–111)
CO2: 24 mmol/L (ref 22–32)
CREATININE: 0.86 mg/dL (ref 0.44–1.00)
Calcium: 9.9 mg/dL (ref 8.9–10.3)
GFR calc Af Amer: >60 mL/min (ref >60)
GFR calc non Af Amer: >60 mL/min (ref >60)
Glucose, Bld: 123 mg/dL — ABNORMAL HIGH (ref 65–99)
Potassium: 3.8 mmol/L (ref 3.5–5.1)
SODIUM: 139 mmol/L (ref 135–145)
Total Bilirubin: 0.7 mg/dL (ref 0.3–1.2)
Total Protein: 6.9 g/dL (ref 6.5–8.1)

## 2016-03-10 LAB — CBC
HCT: 37.5 % (ref 36.0–46.0)
Hemoglobin: 12 g/dL (ref 12.0–15.0)
MCH: 29.8 pg (ref 26.0–34.0)
MCHC: 32 g/dL (ref 30.0–36.0)
MCV: 93.1 fL (ref 78.0–100.0)
PLATELETS: 219 10*3/uL (ref 150–400)
RBC: 4.03 MIL/uL (ref 3.87–5.11)
RDW: 14.8 % (ref 11.5–15.5)
WBC: 12.1 10*3/uL — ABNORMAL HIGH (ref 4.0–10.5)

## 2016-03-10 LAB — I-STAT TROPONIN, ED: Troponin i, poc: 0 ng/mL (ref 0.00–0.08)

## 2016-03-10 LAB — LIPASE, BLOOD: LIPASE: 16 U/L (ref 11–51)

## 2016-03-10 MED ORDER — ONDANSETRON HCL 4 MG/2ML IJ SOLN
4.0000 mg | Freq: Once | INTRAMUSCULAR | Status: AC
  Administered 2016-03-10: 4 mg via INTRAVENOUS
  Filled 2016-03-10: qty 2

## 2016-03-10 MED ORDER — IOPAMIDOL (ISOVUE-300) INJECTION 61%
INTRAVENOUS | Status: AC
  Administered 2016-03-10: 100 mL
  Filled 2016-03-10: qty 100

## 2016-03-10 MED ORDER — MORPHINE SULFATE (PF) 4 MG/ML IV SOLN
2.0000 mg | Freq: Once | INTRAVENOUS | Status: AC
  Administered 2016-03-10: 2 mg via INTRAVENOUS
  Filled 2016-03-10: qty 1

## 2016-03-10 MED ORDER — ACETAMINOPHEN 500 MG PO TABS
1000.0000 mg | ORAL_TABLET | Freq: Once | ORAL | Status: AC
  Administered 2016-03-10: 1000 mg via ORAL
  Filled 2016-03-10: qty 2

## 2016-03-10 MED ORDER — FAMOTIDINE IN NACL 20-0.9 MG/50ML-% IV SOLN
20.0000 mg | Freq: Once | INTRAVENOUS | Status: AC
  Administered 2016-03-10: 20 mg via INTRAVENOUS
  Filled 2016-03-10: qty 50

## 2016-03-10 MED ORDER — SODIUM CHLORIDE 0.9 % IV BOLUS (SEPSIS)
1000.0000 mL | Freq: Once | INTRAVENOUS | Status: AC
  Administered 2016-03-10: 1000 mL via INTRAVENOUS

## 2016-03-10 MED ORDER — GI COCKTAIL ~~LOC~~
30.0000 mL | Freq: Once | ORAL | Status: DC
  Filled 2016-03-10: qty 30

## 2016-03-10 MED ORDER — ONDANSETRON 4 MG PO TBDP
4.0000 mg | ORAL_TABLET | Freq: Once | ORAL | Status: AC | PRN
  Administered 2016-03-10: 4 mg via ORAL

## 2016-03-10 MED ORDER — ONDANSETRON 4 MG PO TBDP
ORAL_TABLET | ORAL | Status: AC
  Filled 2016-03-10: qty 1

## 2016-03-10 MED ORDER — ONDANSETRON 4 MG PO TBDP
4.0000 mg | ORAL_TABLET | Freq: Once | ORAL | Status: AC
  Administered 2016-03-10: 4 mg via ORAL
  Filled 2016-03-10: qty 1

## 2016-03-10 MED ORDER — OXYCODONE HCL 5 MG PO TABS
5.0000 mg | ORAL_TABLET | Freq: Once | ORAL | Status: AC
  Administered 2016-03-10: 5 mg via ORAL
  Filled 2016-03-10: qty 1

## 2016-03-10 NOTE — ED Triage Notes (Signed)
Pt sts vomiting last night with abd pain and burning

## 2016-03-10 NOTE — ED Provider Notes (Signed)
[facility] DEPT Provider Note   CSN: [id number] Arrival date & time: 03/10/16  0904     History   Chief Complaint Chief Complaint  Patient presents with  . Emesis    HPI Katrina Vasquez is a 56 y.o. female.  56 yo F with cc of vomiting.  Hx of multiple abdominal surgeries.  Sharp abdominal pain after eating last night.  Vomit x4.  Severe nausea.  BM x 1 yesterday post enema. No one with similar symptoms.    The history is provided by the patient.  Emesis   Associated symptoms include abdominal pain. Pertinent negatives include no arthralgias, no chills, no fever, no headaches and no myalgias.  Illness  This is a new problem. The current episode started yesterday. The problem occurs constantly. The problem has not changed since onset.Associated symptoms include abdominal pain. Pertinent negatives include no chest pain, no headaches and no shortness of breath. Nothing aggravates the symptoms. Nothing relieves the symptoms. She has tried nothing for the symptoms. The treatment provided no relief.    Past Medical History:  Diagnosis Date  . Cancer ([place])   . Hypercholesteremia   . Hypertension   . 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 S/P hysterectomy 10/01/2014    Past Surgical History:  Procedure Laterality Date  . ABDOMINAL HYSTERECTOMY    . ABDOMINAL SURGERY      OB History    No data available       Home Medications    Prior to Admission medications   Medication Sig Start Date End Date Taking? Authorizing Provider  amitriptyline (ELAVIL) 25 MG tablet Take 50 mg by mouth at bedtime.  07/22/15  Yes Melburn Popper, MD  baclofen (LIORESAL) 10 MG tablet Take 10 mg by mouth 2 (two) times daily.   Yes Historical Provider, MD  DEXILANT 60 MG capsule Take 60 mg by mouth daily. 03/02/16  Yes Historical Provider, MD  HYDROcodone-acetaminophen  (NORCO) 10-325 MG per tablet Take 1-2 tablets by mouth 3 (three) times daily. Take up to 5 tablets every day    Yes Historical Provider, MD  [facility] ER 30 MG T24A Take 30 mg by mouth daily.  10/05/15  Yes Historical Provider, MD  lisinopril-hydrochlorothiazide (PRINZIDE,ZESTORETIC) 20-25 MG tablet Take 1 tablet by mouth daily. 01/23/16  Yes Historical Provider, MD  naloxegol oxalate (MOVANTIK) 25 MG TABS tablet Take 25 mg by mouth daily.   Yes Historical Provider, MD  promethazine (PHENERGAN) 25 MG tablet Take 1 tablet (25 mg total) by mouth 2 (two) times daily. 10/05/15  Yes Recardo Evangelist, PA-C  rosuvastatin (CRESTOR) 10 MG tablet Take 10 mg by mouth daily.   Yes Historical Provider, MD  tizanidine (ZANAFLEX) 2 MG capsule Take 2 mg by mouth at bedtime as needed for muscle spasms.   Yes Historical Provider, MD    Family History History reviewed. No pertinent family history.  Social History Social History  Substance Use Topics  . Smoking status: Never Smoker  . Smokeless tobacco: Never Used  . Alcohol use No     Allergies   Percocet [oxycodone-acetaminophen]; Tramadol; and Vistaril [hydroxyzine hcl]   Review of Systems Review of Systems  Constitutional: Negative for chills and fever.  HENT: Negative for congestion and rhinorrhea.   Eyes: Negative for redness and visual disturbance.  Respiratory: Negative for shortness of breath and wheezing.  Cardiovascular: Negative for chest pain and palpitations.  Gastrointestinal: Positive for abdominal pain and vomiting. Negative for nausea.  Genitourinary: Negative for dysuria and urgency.  Musculoskeletal: Negative for arthralgias and myalgias.  Skin: Negative for pallor and wound.  Neurological: Negative for dizziness and headaches.     Physical Exam Updated Vital Signs BP 146/97   Pulse 101   Temp 98.5 F (36.9 C) (Oral)   Resp (!) 9   SpO2 99%   Physical Exam  Constitutional: She is oriented to person, place, and time.  She appears well-developed and well-nourished. No distress.  HENT:  Head: Normocephalic and atraumatic.  Eyes: EOM are normal. Pupils are equal, round, and reactive to light.  Neck: Normal range of motion. Neck supple.  Cardiovascular: Normal rate and regular rhythm.  Exam reveals no gallop and no friction rub.   No murmur heard. Pulmonary/Chest: Effort normal. She has no wheezes. She has no rales.  Abdominal: Soft. She exhibits no distension and no mass. There is tenderness (epigastric). There is no guarding.  Musculoskeletal: She exhibits no edema or tenderness.  Neurological: She is alert and oriented to person, place, and time.  Skin: Skin is warm and dry. She is not diaphoretic.  Psychiatric: She has a normal mood and affect. Her behavior is normal.  Nursing note and vitals reviewed.    ED Treatments / Results  Labs (all labs ordered are listed, but only abnormal results are displayed) Labs Reviewed  COMPREHENSIVE METABOLIC PANEL - Abnormal; Notable for the following:       Result Value   Glucose, Bld 123 (*)    BUN 23 (*)    All other components within normal limits  CBC - Abnormal; Notable for the following:    WBC 12.1 (*)    All other components within normal limits  URINALYSIS, ROUTINE W REFLEX MICROSCOPIC - Abnormal; Notable for the following:    Specific Gravity, Urine >1.046 (*)    All other components within normal limits  LIPASE, BLOOD  I-STAT TROPOININ, ED    EKG  EKG Interpretation  Date/Time:  Friday March 10 2016 11:10:10 EST Ventricular Rate:  104 PR Interval:    QRS Duration: 45 QT Interval:  384 QTC Calculation: 506 R Axis:   28 Text Interpretation:  Sinus tachycardia Low voltage, precordial leads Probable anteroseptal infarct, old Nonspecific T abnormalities, lateral leads No significant change since last tracing Confirmed by Tayvon Culley MD, Quillian Quince [phone]) on 03/10/2016 11:59:24 AM       Radiology Ct Abdomen Pelvis W Contrast  Result Date:  03/10/2016 CLINICAL DATA:  Upper abdominal pain starting yesterday, history of multiple surgeries for small bowel obstruction EXAM: CT ABDOMEN AND PELVIS WITH CONTRAST TECHNIQUE: Multidetector CT imaging of the abdomen and pelvis was performed using the standard protocol following bolus administration of intravenous contrast. CONTRAST:  163mL ISOVUE-300 IOPAMIDOL (ISOVUE-300) INJECTION 61% COMPARISON:  09/17/2015. FINDINGS: Lower chest: Lung bases shows atelectasis or early infiltrate in right lower lobe posteriorly please see axial image 7. Hepatobiliary: Enhanced liver shows no focal mass. The patient is status post cholecystectomy. No intrahepatic biliary ductal dilatation. Pancreas: Enhanced pancreas without focal abnormality. No peripancreatic inflammation. Spleen: Enhanced spleen with normal appearance. Adrenals/Urinary Tract: No adrenal gland mass. Enhanced kidneys are symmetrical in size. No hydronephrosis or hydroureter. Nonobstructive calculus in midpole of the right kidney measures 4.3 mm. Punctate nonobstructive calculus in upper pole of the left kidney measures 2 mm. Delayed renal images shows bilateral renal symmetrical excretion. Bilateral visualized proximal ureter is  unremarkable. Stomach/Bowel: Small hiatal hernia again noted. No gastric outlet obstruction. No small bowel obstruction. Stable minimal segmental distension of small bowel in right abdomen axial image 67 probable chronic in nature or postsurgical in nature. There is no small bowel obstruction as fluid is noted in terminal ileum. No abrupt transition in caliber of small bowel. Again noted low lying cecum. There is some colonic stool within cecum. No pericecal inflammation. The appendix is not identified. No evidence of colitis or diverticulitis. Vascular/Lymphatic: No aortic aneurysm.  No adenopathy. Reproductive: The patient is status post hysterectomy. No pelvic masses noted. Other: No ascites or free abdominal air. Musculoskeletal: No  destructive bony lesions are noted. Degenerative changes are noted lumbar spine at L5-S1 level. Significant disc space flattening with vacuum disc phenomenon endplate sclerotic changes at L5-S1 level. Mild to moderate posterior disc bulge at L4-L5 level. IMPRESSION: 1. There is small patchy atelectasis or early infiltrate in right lower lobe posteriorly please see axial image 7. Clinical correlation is necessary. 2. Status post cholecystectomy. No intrahepatic biliary ductal dilatation. 3. No small bowel or colonic obstruction. No pericecal inflammation. There is a low lying cecum. The appendix is not identified. 4. Status post hysterectomy. 5. Degenerative changes lumbar spine at L4-L5 and L5-S1 level. Electronically Signed   By: Lahoma Crocker M.D.   On: 03/10/2016 13:55    Procedures Procedures (including critical care time)  Medications Ordered in ED Medications  ondansetron (ZOFRAN-ODT) 4 MG disintegrating tablet (not administered)  gi cocktail (Maalox,Lidocaine,Donnatal) (30 mLs Oral Not Given 03/10/16 1051)  oxyCODONE (Oxy IR/ROXICODONE) immediate release tablet 5 mg (not administered)  ondansetron (ZOFRAN-ODT) disintegrating tablet 4 mg (4 mg Oral Given 03/10/16 0912)  acetaminophen (TYLENOL) tablet 1,000 mg (1,000 mg Oral Given 03/10/16 1051)  sodium chloride 0.9 % bolus 1,000 mL (0 mLs Intravenous Stopped 03/10/16 1341)  famotidine (PEPCID) IVPB 20 mg premix (0 mg Intravenous Stopped 03/10/16 1203)  ondansetron (ZOFRAN-ODT) disintegrating tablet 4 mg (4 mg Oral Given 03/10/16 1133)  morphine 4 MG/ML injection 2 mg (2 mg Intravenous Given 03/10/16 1157)  ondansetron (ZOFRAN) injection 4 mg (4 mg Intravenous Given 03/10/16 1158)  iopamidol (ISOVUE-300) 61 % injection (100 mLs  Contrast Given 03/10/16 1323)     Initial Impression / Assessment and Plan / ED Course  I have reviewed the triage vital signs and the nursing notes.  Pertinent labs & imaging results that were available during my care of  the patient were reviewed by me and considered in my medical decision making (see chart for details).     56 yo F with a cc of epigastric abdominal pain.  This been going on for the past day. Patient denies any fevers or chills. She says it feels like her normal reflexes does not feel that is gotten better with her normal reflux medications. She is on chronic Phenergan for nausea 3 times a day. She is unable to tolerate a GI cocktail. Lab work is reassuring. Patient with persistent pain CT scan with no obstruction. No other significant findings. Love the patient follow-up with her gastroenterologist.  2:47 PM:  I have discussed the diagnosis/risks/treatment options with the patient and family and believe the pt to be eligible for discharge home to follow-up with PCP. We also discussed returning to the ED immediately if new or worsening sx occur. We discussed the sx which are most concerning (e.g., sudden worsening pain, fever, inability to tolerate by mouth) that necessitate immediate return. Medications administered to the patient during their visit  and any new prescriptions provided to the patient are listed below.  Medications given during this visit Medications  ondansetron (ZOFRAN-ODT) 4 MG disintegrating tablet (not administered)  gi cocktail (Maalox,Lidocaine,Donnatal) (30 mLs Oral Not Given 03/10/16 1051)  oxyCODONE (Oxy IR/ROXICODONE) immediate release tablet 5 mg (not administered)  ondansetron (ZOFRAN-ODT) disintegrating tablet 4 mg (4 mg Oral Given 03/10/16 0912)  acetaminophen (TYLENOL) tablet 1,000 mg (1,000 mg Oral Given 03/10/16 1051)  sodium chloride 0.9 % bolus 1,000 mL (0 mLs Intravenous Stopped 03/10/16 1341)  famotidine (PEPCID) IVPB 20 mg premix (0 mg Intravenous Stopped 03/10/16 1203)  ondansetron (ZOFRAN-ODT) disintegrating tablet 4 mg (4 mg Oral Given 03/10/16 1133)  morphine 4 MG/ML injection 2 mg (2 mg Intravenous Given 03/10/16 1157)  ondansetron (ZOFRAN) injection 4 mg (4 mg  Intravenous Given 03/10/16 1158)  iopamidol (ISOVUE-300) 61 % injection (100 mLs  Contrast Given 03/10/16 1323)     The patient appears reasonably screen and/or stabilized for discharge and I doubt any other medical condition or other [facility] requiring further screening, evaluation, or treatment in the ED at this time prior to discharge.    Final Clinical Impressions(s) / ED Diagnoses   Final diagnoses:  Epigastric abdominal pain    New Prescriptions New Prescriptions   No medications on file     Deno Etienne, DO 03/10/16 1447

## 2016-04-24 ENCOUNTER — Other Ambulatory Visit: Payer: Self-pay | Admitting: Pain Medicine

## 2016-04-24 DIAGNOSIS — M545 Low back pain: Secondary | ICD-10-CM

## 2016-05-21 ENCOUNTER — Ambulatory Visit
Admission: RE | Admit: 2016-05-21 | Discharge: 2016-05-21 | Disposition: A | Discharge status: Home / Self Care | Payer: Medicare Other | Source: Ambulatory Visit | Attending: Pain Medicine | Admitting: Pain Medicine

## 2016-05-21 DIAGNOSIS — M545 Low back pain: Secondary | ICD-10-CM

## 2016-06-21 ENCOUNTER — Emergency Department (HOSPITAL_COMMUNITY): Payer: Medicare Other

## 2016-06-21 ENCOUNTER — Encounter (HOSPITAL_COMMUNITY): Payer: Self-pay

## 2016-06-21 ENCOUNTER — Emergency Department (HOSPITAL_COMMUNITY)
Admission: EM | Admit: 2016-06-21 | Discharge: 2016-06-22 | Disposition: A | Discharge status: Home / Self Care | Payer: Medicare Other | Attending: Emergency Medicine | Admitting: Emergency Medicine

## 2016-06-21 DIAGNOSIS — Z86718 Personal history of other venous thrombosis and embolism: Secondary | ICD-10-CM | POA: Diagnosis not present

## 2016-06-21 DIAGNOSIS — Z8541 Personal history of malignant neoplasm of cervix uteri: Secondary | ICD-10-CM | POA: Insufficient documentation

## 2016-06-21 DIAGNOSIS — R112 Nausea with vomiting, unspecified: Secondary | ICD-10-CM | POA: Diagnosis not present

## 2016-06-21 DIAGNOSIS — E7800 Pure hypercholesterolemia, unspecified: Secondary | ICD-10-CM | POA: Diagnosis not present

## 2016-06-21 DIAGNOSIS — R109 Unspecified abdominal pain: Secondary | ICD-10-CM

## 2016-06-21 DIAGNOSIS — R1031 Right lower quadrant pain: Secondary | ICD-10-CM | POA: Diagnosis not present

## 2016-06-21 DIAGNOSIS — I10 Essential (primary) hypertension: Secondary | ICD-10-CM | POA: Insufficient documentation

## 2016-06-21 LAB — CBC
HEMATOCRIT: 38.2 % (ref 36.0–46.0)
HEMOGLOBIN: 12 g/dL (ref 12.0–15.0)
MCH: 28.5 pg (ref 26.0–34.0)
MCHC: 31.4 g/dL (ref 30.0–36.0)
MCV: 90.7 fL (ref 78.0–100.0)
Platelets: 252 10*3/uL (ref 150–400)
RBC: 4.21 MIL/uL (ref 3.87–5.11)
RDW: 15.2 % (ref 11.5–15.5)
WBC: 9.9 10*3/uL (ref 4.0–10.5)

## 2016-06-21 LAB — COMPREHENSIVE METABOLIC PANEL
ALBUMIN: 3.6 g/dL (ref 3.5–5.0)
ALT: 20 U/L (ref 14–54)
ANION GAP: 9 (ref 5–15)
AST: 21 U/L (ref 15–41)
Alkaline Phosphatase: 58 U/L (ref 38–126)
BUN: 11 mg/dL (ref 6–20)
CO2: 26 mmol/L (ref 22–32)
Calcium: 9.8 mg/dL (ref 8.9–10.3)
Chloride: 105 mmol/L (ref 101–111)
Creatinine, Ser: 0.83 mg/dL (ref 0.44–1.00)
GFR calc non Af Amer: >60 mL/min (ref >60)
GLUCOSE: 113 mg/dL — AB (ref 65–99)
POTASSIUM: 3.3 mmol/L — AB (ref 3.5–5.1)
SODIUM: 140 mmol/L (ref 135–145)
Total Bilirubin: 0.5 mg/dL (ref 0.3–1.2)
Total Protein: 6.7 g/dL (ref 6.5–8.1)

## 2016-06-21 LAB — URINALYSIS, ROUTINE W REFLEX MICROSCOPIC
BILIRUBIN URINE: NEGATIVE
Glucose, UA: NEGATIVE mg/dL
HGB URINE DIPSTICK: NEGATIVE
KETONES UR: 5 mg/dL — AB
NITRITE: NEGATIVE
PROTEIN: 30 mg/dL — AB
Specific Gravity, Urine: 1.031 — ABNORMAL HIGH (ref 1.005–1.030)
pH: 6 (ref 5.0–8.0)

## 2016-06-21 LAB — I-STAT CG4 LACTIC ACID, ED: Lactic Acid, Venous: 1.21 mmol/L (ref 0.5–1.9)

## 2016-06-21 LAB — LIPASE, BLOOD: Lipase: 20 U/L (ref 11–51)

## 2016-06-21 MED ORDER — ONDANSETRON HCL 4 MG/2ML IJ SOLN
4.0000 mg | Freq: Once | INTRAMUSCULAR | Status: AC
  Administered 2016-06-21: 4 mg via INTRAVENOUS
  Filled 2016-06-21: qty 2

## 2016-06-21 MED ORDER — FENTANYL CITRATE (PF) 100 MCG/2ML IJ SOLN
50.0000 ug | Freq: Once | INTRAMUSCULAR | Status: AC
  Administered 2016-06-21: 50 ug via INTRAVENOUS
  Filled 2016-06-21: qty 2

## 2016-06-21 MED ORDER — SODIUM CHLORIDE 0.9 % IV BOLUS (SEPSIS)
1000.0000 mL | Freq: Once | INTRAVENOUS | Status: AC
  Administered 2016-06-21: 1000 mL via INTRAVENOUS

## 2016-06-21 MED ORDER — KETOROLAC TROMETHAMINE 30 MG/ML IJ SOLN
30.0000 mg | Freq: Once | INTRAMUSCULAR | Status: AC
  Administered 2016-06-21: 30 mg via INTRAVENOUS
  Filled 2016-06-21: qty 1

## 2016-06-21 NOTE — ED Notes (Signed)
Patient transported to X-ray 

## 2016-06-21 NOTE — ED Triage Notes (Signed)
Patient complains of 1 week of right side pain with radiation to right flank with nausea and vomiting x 2 days. Denies injury. Denies dysuria, no fever, no chills

## 2016-06-21 NOTE — ED Provider Notes (Signed)
[facility] DEPT Provider Note   CSN: [id number] Arrival date & time: 06/21/16  1626    History   Chief Complaint Chief Complaint  Patient presents with  . Abdominal Pain    HPI Katrina Vasquez is a 56 y.o. female.  56 year old female with hx of left renal vein thrombosis (resolved spontaneously), SBO, cervical cancer s/p TAH/BSO, HTN, HLD. She has had multiple abdominal surgeries including cholecystectomy, appendectomy, and hysterectomy. Patient presents to the emergency department for evaluation of one week of dull right lower quadrant pain. She states that pain has been waxing and waning in severity. She usually takes Norco for chronic pain which is prescribed by pain management. She ran out of this medication 3 days ago. She notes associated nausea and vomiting. She had multiple episodes of emesis yesterday. No vomiting today. Patient denies any known fevers. She denies dysuria, hematuria, diarrhea, melena, hematochezia. Last bowel movement was yesterday and was normal, per patient.   She has been seen in the emergency department in the past for exacerbations of chronic abdominal pain, often lateralized to the right.      Past Medical History:  Diagnosis Date  . Cancer ([place])   . Hypercholesteremia   . Hypertension   . Thrombosis of left renal vein ([place]) 09/08/2015    Patient Active Problem List   Diagnosis Date Noted  . 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 S/P hysterectomy 10/01/2014    Past Surgical History:  Procedure Laterality Date  . ABDOMINAL HYSTERECTOMY    . ABDOMINAL SURGERY      OB History    No data available       Home Medications    Prior to Admission medications   Medication Sig Start Date End Date Taking? Authorizing Provider  amitriptyline (ELAVIL) 25 MG tablet Take 50 mg by mouth at bedtime.  07/22/15   Melburn Popper, MD  baclofen (LIORESAL) 10 MG tablet Take 10 mg by mouth 2 (two) times daily.    [provider]  DEXILANT 60 MG capsule Take 60 mg by mouth daily. 03/02/16   [provider]  HYDROcodone-acetaminophen (NORCO) 10-325 MG per tablet Take 1-2 tablets by mouth 3 (three) times daily. Take up to 5 tablets every day     [provider]  [facility] ER 30 MG T24A Take 30 mg by mouth daily.  10/05/15   [provider]  lisinopril-hydrochlorothiazide (PRINZIDE,ZESTORETIC) 20-25 MG tablet Take 1 tablet by mouth daily. 01/23/16   [provider]  naloxegol oxalate (MOVANTIK) 25 MG TABS tablet Take 25 mg by mouth daily.    [provider]  promethazine (PHENERGAN) 25 MG tablet Take 1 tablet (25 mg total) by mouth every 6 (six) hours as needed for nausea or vomiting. 06/22/16   Antonietta Breach, PA-C  rosuvastatin (CRESTOR) 10 MG tablet Take 10 mg by mouth daily.    [provider]  tizanidine (ZANAFLEX) 2 MG capsule Take 2 mg by mouth at bedtime as needed for muscle spasms.    [provider]    Family History No family history on file.  Social History Social History  Substance Use Topics  . Smoking status: Never Smoker  . Smokeless tobacco: Never Used  . Alcohol use No     Allergies   Percocet [oxycodone-acetaminophen]; Tramadol; and Vistaril [hydroxyzine hcl]   Review of Systems Review of Systems Ten systems reviewed and are negative for acute change, except as noted in the HPI.  Physical Exam Updated Vital Signs BP (!) 142/94   Pulse (!) 111   Temp 98.6 F (37 C) (Oral)   Resp 16   SpO2 100%   Physical Exam  Constitutional: She is oriented to person, place, and time. She appears well-developed and well-nourished. No distress.  Nontoxic appearing and in no acute distress  HENT:  Head: Normocephalic and atraumatic.  Eyes: Conjunctivae and EOM are normal. No scleral icterus.  Neck: Normal range of motion.  Cardiovascular: Normal rate, regular rhythm and intact distal pulses.   Patient not tachycardic as noted in triage    Pulmonary/Chest: Effort normal. No respiratory distress. She has no wheezes. She has no rales.  Respirations even and unlabored.  Abdominal: Soft. She exhibits no mass. There is tenderness. There is no guarding.  Soft, nondistended abdomen with mild tenderness in the lateral right lower quadrant. No masses or peritoneal signs.  Musculoskeletal: Normal range of motion.  Neurological: She is alert and oriented to person, place, and time. She exhibits normal muscle tone. Coordination normal.  Skin: Skin is warm and dry. No rash noted. She is not diaphoretic. No erythema. No pallor.  Psychiatric: She has a normal mood and affect. Her behavior is normal.  Nursing note and vitals reviewed.    ED Treatments / Results  Labs (all labs ordered are listed, but only abnormal results are displayed) Labs Reviewed  COMPREHENSIVE METABOLIC PANEL - Abnormal; Notable for the following:       Result Value   Potassium 3.3 (*)    Glucose, Bld 113 (*)    All other components within normal limits  URINALYSIS, ROUTINE W REFLEX MICROSCOPIC - Abnormal; Notable for the following:    APPearance HAZY (*)    Specific Gravity, Urine 1.031 (*)    Ketones, ur 5 (*)    Protein, ur 30 (*)    Leukocytes, UA TRACE (*)    Bacteria, UA RARE (*)    Squamous Epithelial / LPF 6-30 (*)    All other components within normal limits  LIPASE, BLOOD  CBC  I-STAT CG4 LACTIC ACID, ED    EKG  EKG Interpretation None       Radiology Dg Abd 2 Views  Result Date: 06/21/2016 CLINICAL DATA:  One week of right sided pain radiating to the right flank with nausea and vomiting x2 days. EXAM: ABDOMEN - 2 VIEW COMPARISON:  03/10/2016 CT FINDINGS: Cholecystectomy clips are noted in the right upper quadrant with scattered surgical clips in the right lower quadrant and left hemipelvis. Bowel gas pattern is unremarkable. No acute osseous abnormality. There is lower lumbar degenerative disc and facet arthropathy. No free air is  identified. There is no organomegaly. Tiny calcification is noted projecting over the right renal shadow measuring 4 mm consistent with prior findings on comparison CT. The left renal shadow is obscured by overlying bowel. IMPRESSION: 4 mm right mid renal calculus consistent with CT findings. Unremarkable bowel gas pattern. Electronically Signed   By: Ashley Royalty M.D.   On: 06/21/2016 22:39    Procedures Procedures (including critical care time)  Medications Ordered in ED Medications  morphine (MS CONTIN) 12 hr tablet 15 mg (not administered)  sodium chloride 0.9 % bolus 1,000 mL (0 mLs Intravenous Stopped 06/22/16 0103)  ketorolac (TORADOL) 30 MG/ML injection 30 mg (30 mg Intravenous Given 06/21/16 2309)  fentaNYL (SUBLIMAZE) injection 50 mcg (50 mcg Intravenous Given 06/21/16 2306)  ondansetron (ZOFRAN) injection 4 mg (4 mg Intravenous Given 06/21/16 2309)  HYDROcodone-acetaminophen (  NORCO/VICODIN) 5-325 MG per tablet 2 tablet (2 tablets Oral Given 06/22/16 0011)     Initial Impression / Assessment and Plan / ED Course  I have reviewed the triage vital signs and the nursing notes.  Pertinent labs & imaging results that were available during my care of the patient were reviewed by me and considered in my medical decision making (see chart for details).     56 year old female presents to the emergency department for evaluation of right lower quadrant abdominal pain. She no longer has her gallbladder, appendix, uterus, or ovaries. She has had similar presentations to the emergency department for abdominal pain as well as right lower quadrant pain. She has history of right renal venous thrombosis in February 2017 which was found to have resolved spontaneously on subsequent CT in August.  Patient reports that her symptoms have been persistent over the past week. She has had associated nausea and vomiting which was worse yesterday. No vomiting today. Laboratory workup and urinalysis reassuring.  Abdominal x-ray shows no evidence of obstruction or free air. Lactic acid level normal.  Patient reports being out of her pain management prescriptions. She is on high doses of daily hydrocodone. It is possible that nausea and vomiting yesterday were secondary to withdrawal from opiates. Abdominal repeat exam remains stable and reassuring. No peritoneal signs. Low suspicion for emergent process. The patient has been instructed to follow-up with her primary care doctor regarding her visit today. Return precautions discussed and provided. Patient discharged in stable condition with no unaddressed concerns.   Final Clinical Impressions(s) / ED Diagnoses   Final diagnoses:  Abdominal pain  Nausea and vomiting, intractability of vomiting not specified, unspecified vomiting type    New Prescriptions New Prescriptions   PROMETHAZINE (PHENERGAN) 25 MG TABLET    Take 1 tablet (25 mg total) by mouth every 6 (six) hours as needed for nausea or vomiting.     Antonietta Breach, PA-C 06/22/16 2330    Varney Biles, MD 06/23/16 0762

## 2016-06-22 MED ORDER — HYDROCODONE BITARTRATE ER 30 MG PO T24A: 30.0000 mg | EXTENDED_RELEASE_TABLET | Freq: Every day | ORAL | Status: DC

## 2016-06-22 MED ORDER — MORPHINE SULFATE ER 15 MG PO TBCR: 30.0000 mg | EXTENDED_RELEASE_TABLET | Freq: Two times a day (BID) | ORAL | Status: DC

## 2016-06-22 MED ORDER — MORPHINE SULFATE ER 15 MG PO TBCR
15.0000 mg | EXTENDED_RELEASE_TABLET | Freq: Two times a day (BID) | ORAL | Status: DC
  Administered 2016-06-22: 15 mg via ORAL
  Filled 2016-06-22: qty 1

## 2016-06-22 MED ORDER — HYDROCODONE-ACETAMINOPHEN 5-325 MG PO TABS
2.0000 | ORAL_TABLET | Freq: Once | ORAL | Status: AC
  Administered 2016-06-22: 2 via ORAL
  Filled 2016-06-22: qty 2

## 2016-06-22 MED ORDER — PROMETHAZINE HCL 25 MG PO TABS: 25.0000 mg | ORAL_TABLET | Freq: Four times a day (QID) | ORAL | 0 refills | Status: DC | PRN

## 2016-06-22 NOTE — ED Notes (Signed)
Pharmacy does not carry ordered medication - PA-C aware and to modify - see new orders.

## 2016-06-22 NOTE — Discharge Instructions (Signed)
Follow-up with your pain management clinic as well as your primary care doctor regarding your visit today. You may take Phenergan as needed for nausea/vomiting. Be sure to drink plenty of fluids to prevent dehydration.Return for new or worsening symptoms.

## 2016-06-22 NOTE — ED Notes (Signed)
Patient verbalized understanding of discharge instructions and denies any further needs or questions at this time. VS stable. Patient ambulatory with steady gait. RN escorted to ED entrance in wheelchair where she will wait until her family member picks her up.

## 2016-12-27 ENCOUNTER — Encounter (HOSPITAL_COMMUNITY): Payer: Self-pay | Admitting: *Deleted

## 2016-12-27 ENCOUNTER — Other Ambulatory Visit: Payer: Self-pay

## 2016-12-27 ENCOUNTER — Emergency Department (HOSPITAL_COMMUNITY)
Admission: EM | Admit: 2016-12-27 | Discharge: 2016-12-28 | Disposition: A | Discharge status: Home / Self Care | Payer: Medicare Other | Attending: Emergency Medicine | Admitting: Emergency Medicine

## 2016-12-27 DIAGNOSIS — Z8541 Personal history of malignant neoplasm of cervix uteri: Secondary | ICD-10-CM | POA: Diagnosis not present

## 2016-12-27 DIAGNOSIS — M79622 Pain in left upper arm: Secondary | ICD-10-CM | POA: Insufficient documentation

## 2016-12-27 DIAGNOSIS — I10 Essential (primary) hypertension: Secondary | ICD-10-CM | POA: Diagnosis not present

## 2016-12-27 DIAGNOSIS — M79602 Pain in left arm: Secondary | ICD-10-CM

## 2016-12-27 DIAGNOSIS — M79621 Pain in right upper arm: Secondary | ICD-10-CM | POA: Diagnosis not present

## 2016-12-27 DIAGNOSIS — M79642 Pain in left hand: Secondary | ICD-10-CM | POA: Diagnosis not present

## 2016-12-27 DIAGNOSIS — Z79899 Other long term (current) drug therapy: Secondary | ICD-10-CM | POA: Insufficient documentation

## 2016-12-27 DIAGNOSIS — M79641 Pain in right hand: Secondary | ICD-10-CM | POA: Diagnosis present

## 2016-12-27 DIAGNOSIS — M79601 Pain in right arm: Secondary | ICD-10-CM

## 2016-12-27 HISTORY — DX: Other chronic pain: G89.29

## 2016-12-27 HISTORY — DX: Carpal tunnel syndrome, unspecified upper limb: G56.00

## 2016-12-27 MED ORDER — KETOROLAC TROMETHAMINE 30 MG/ML IJ SOLN
60.0000 mg | Freq: Once | INTRAMUSCULAR | Status: AC
  Administered 2016-12-27: 60 mg via INTRAMUSCULAR
  Filled 2016-12-27: qty 2

## 2016-12-27 MED ORDER — HYDROCODONE-ACETAMINOPHEN 5-325 MG PO TABS
1.0000 | ORAL_TABLET | Freq: Once | ORAL | Status: AC
  Administered 2016-12-27: 1 via ORAL
  Filled 2016-12-27: qty 1

## 2016-12-27 MED ORDER — ONDANSETRON 4 MG PO TBDP
4.0000 mg | ORAL_TABLET | Freq: Once | ORAL | Status: AC
  Administered 2016-12-27: 4 mg via ORAL
  Filled 2016-12-27: qty 1

## 2016-12-27 NOTE — ED Notes (Signed)
EDP at bedside  

## 2016-12-27 NOTE — ED Provider Notes (Addendum)
[facility] EMERGENCY DEPARTMENT Provider Note   CSN: [id number] Arrival date & time: 12/27/16  1655     History   Chief Complaint Chief Complaint  Patient presents with  . Hand Pain  . Emesis    HPI Katrina Vasquez is a 56 y.o. female.  Patient h/o carpal tunnel, ongoing, x years, bilateral, > on R. She presents tonight stating that her pain is the worst they have been for the past 2 days. She describes the pain as achey, sharp, radiating proximally from hands and that it is a 10/10. She has Norco at home and reports small improvement with use. She has been taking Aleve, Tylenol, Norco at home without resolution and presents for evaluation of ongoing pain.    The history is provided by the patient. No language interpreter was used.  Hand Pain   Emesis      Past Medical History:  Diagnosis Date  . Cancer ([place])    cervical in remission  . Carpal tunnel syndrome   . Chronic pain   . Hypercholesteremia   . Hypertension   . 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 S/P hysterectomy 10/01/2014    Past Surgical History:  Procedure Laterality Date  . ABDOMINAL HYSTERECTOMY    . ABDOMINAL SURGERY    . CHOLECYSTECTOMY      OB History    No data available       Home Medications    Prior to Admission medications   Medication Sig Start Date End Date Taking? Authorizing Provider  acetaminophen (TYLENOL) 500 MG tablet Take 500-1,000 mg by mouth every 6 (six) hours as needed (for pain or headaches).    Yes [provider]  amitriptyline (ELAVIL) 25 MG tablet Take 75 mg by mouth at bedtime.  07/22/15  Yes Melburn Popper, MD  baclofen (LIORESAL) 10 MG tablet Take 10 mg by mouth 2 (two) times daily as needed for muscle spasms.    Yes [provider]  DEXILANT 60 MG capsule Take 60 mg by mouth 2 (two) times daily.   03/02/16  Yes [provider]  [place] 2 MG/0.4ML SOAJ Call 911. Inject 1 auto-injector intramuscularly in shoulder or thigh. Repeat every 3 minutes as needed if no or minimal response. 11/03/16  Yes [provider]  HYDROcodone-acetaminophen (NORCO) 10-325 MG per tablet Take 1 tablet by mouth 2 (two) times daily. Take up to 5 tablets every day    Yes [provider]  [facility] ER 20 MG T24A Take 20 mg by mouth daily. 12/07/16  Yes [provider]  ibuprofen (ADVIL,MOTRIN) 200 MG tablet Take 200-400 mg by mouth every 6 (six) hours as needed (for back pain).    Yes [provider]  lisinopril-hydrochlorothiazide (PRINZIDE,ZESTORETIC) 20-25 MG tablet Take 1 tablet by mouth daily. 01/23/16  Yes [provider]  naloxegol oxalate (MOVANTIK) 25 MG TABS tablet Take 25 mg by mouth daily.   Yes [provider]  promethazine (PHENERGAN) 25 MG tablet Take 1 tablet (25 mg total) by mouth every 6 (six) hours as needed for nausea or vomiting. 06/22/16  Yes Antonietta Breach, PA-C  rosuvastatin (CRESTOR) 10 MG tablet Take 10 mg by mouth daily.   Yes [provider]  tizanidine (ZANAFLEX) 2 MG capsule Take 2 mg by mouth at bedtime as needed for muscle spasms.  Yes [provider]  VENTOLIN HFA 108 (90 Base) MCG/ACT inhaler Inhale 2 puffs into the lungs 4 (four) times daily as needed for shortness of breath.  12/25/16  Yes [provider]    Family History No family history on file.  Social History Social History   Tobacco Use  . Smoking status: Never Smoker  . Smokeless tobacco: Never Used  Substance Use Topics  . Alcohol use: No  . Drug use: No     Allergies   Percocet [oxycodone-acetaminophen]; Tramadol; and Vistaril [hydroxyzine hcl]   Review of Systems Review of Systems  Constitutional: Negative for fatigue.  Gastrointestinal: Positive for vomiting (x 1 after taking Norco).  Musculoskeletal:       See HPI.    Skin: Negative for color change.  Neurological: Negative for weakness and numbness.     Physical Exam Updated Vital Signs BP (!) 142/95 (BP Location: Left Arm)   Pulse 93   Temp 98.3 F (36.8 C) (Oral)   Resp (!) 23   Ht 5\' 3"  (1.6 m)   Wt 61.2 kg (135 lb)   SpO2 100%   BMI 23.91 kg/m   Physical Exam  Constitutional: She is oriented to person, place, and time. She appears well-developed and well-nourished.  Uncomfortable appearing.  Neck: Normal range of motion.  Pulmonary/Chest: Effort normal.  Musculoskeletal: She exhibits tenderness (Generally tender to forearms bilaterally. ). She exhibits no edema or deformity.  Negative Phalen's and Tinnel's testing bilateral UE.  Neurological: She is alert and oriented to person, place, and time.  Skin: Skin is warm and dry.     ED Treatments / Results  Labs (all labs ordered are listed, but only abnormal results are displayed) Labs Reviewed - No data to display  EKG  EKG Interpretation None       Radiology No results found.  Procedures Procedures (including critical care time)  Medications Ordered in ED Medications  ondansetron (ZOFRAN-ODT) disintegrating tablet 4 mg (4 mg Oral Given 12/27/16 1801)  HYDROcodone-acetaminophen (NORCO/VICODIN) 5-325 MG per tablet 1 tablet (1 tablet Oral Given 12/27/16 1801)     Initial Impression / Assessment and Plan / ED Course  I have reviewed the triage vital signs and the nursing notes.  Pertinent labs & imaging results that were available during my care of the patient were reviewed by me and considered in my medical decision making (see chart for details).     Patient is here with complaint of bilateral wrist and arm pain she feels is related to a previous diagnosis of carpal tunnel syndrome.   History and examination not consistent with carpal tunnel syndrome.   IM toradol provided without any relief. Further discussion reveals the patient is on Pain Management for chronic  back pain and recently had her dose of Norco decreased from 5 a day to 2 a day. She states the arm pain came up after this and she can't control it.   No neurologic deficits or evidence of infection. Doubt acute condition requiring emergent intervention. Will give one shot of pain medication and send her to her PM doctor for further management. Patient is comfortable with plan.   Final Clinical Impressions(s) / ED Diagnoses   Final diagnoses:  None   1. Bilateral Upper extremity pain   ED Discharge Orders    None       Dennie Bible 12/28/16 0017    Charlesetta Shanks, MD 12/28/16 [place], [place], PA-C 01/14/17 [phone]  Charlesetta Shanks, MD 01/18/17 [phone]

## 2016-12-27 NOTE — ED Triage Notes (Signed)
Pt states bil hand pain since yesterday.  Now pain radiates to shoulders and she is nauseated and vomited this am.

## 2016-12-28 MED ORDER — HYDROMORPHONE HCL 1 MG/ML IJ SOLN
1.0000 mg | Freq: Once | INTRAMUSCULAR | Status: AC
  Administered 2016-12-28: 1 mg via INTRAMUSCULAR
  Filled 2016-12-28: qty 1

## 2016-12-29 ENCOUNTER — Other Ambulatory Visit: Payer: Self-pay

## 2016-12-29 ENCOUNTER — Encounter (HOSPITAL_COMMUNITY): Payer: Self-pay | Admitting: Emergency Medicine

## 2016-12-29 DIAGNOSIS — Z9049 Acquired absence of other specified parts of digestive tract: Secondary | ICD-10-CM | POA: Insufficient documentation

## 2016-12-29 DIAGNOSIS — G5601 Carpal tunnel syndrome, right upper limb: Secondary | ICD-10-CM | POA: Diagnosis not present

## 2016-12-29 DIAGNOSIS — I10 Essential (primary) hypertension: Secondary | ICD-10-CM

## 2016-12-29 DIAGNOSIS — M79601 Pain in right arm: Secondary | ICD-10-CM | POA: Insufficient documentation

## 2016-12-29 DIAGNOSIS — Z8541 Personal history of malignant neoplasm of cervix uteri: Secondary | ICD-10-CM | POA: Insufficient documentation

## 2016-12-29 DIAGNOSIS — M79602 Pain in left arm: Secondary | ICD-10-CM

## 2016-12-29 DIAGNOSIS — Z79899 Other long term (current) drug therapy: Secondary | ICD-10-CM | POA: Insufficient documentation

## 2016-12-29 NOTE — ED Triage Notes (Signed)
Pt c/o R hand pain radiating to shoulder and neck, pt was seen for similar 2 days ago but she states pain now radiates to neck not just to shoulder.

## 2016-12-30 ENCOUNTER — Encounter (HOSPITAL_COMMUNITY): Payer: Self-pay | Admitting: Emergency Medicine

## 2016-12-30 ENCOUNTER — Emergency Department (HOSPITAL_COMMUNITY)
Admission: EM | Admit: 2016-12-30 | Discharge: 2016-12-30 | Disposition: A | Discharge status: Home / Self Care | Payer: Medicare Other | Attending: Emergency Medicine | Admitting: Emergency Medicine

## 2016-12-30 ENCOUNTER — Emergency Department (HOSPITAL_COMMUNITY)
Admission: EM | Admit: 2016-12-30 | Discharge: 2016-12-30 | Disposition: A | Discharge status: Home / Self Care | Payer: Medicare Other | Source: Home / Self Care | Attending: Emergency Medicine | Admitting: Emergency Medicine

## 2016-12-30 DIAGNOSIS — M79601 Pain in right arm: Secondary | ICD-10-CM

## 2016-12-30 DIAGNOSIS — M79602 Pain in left arm: Principal | ICD-10-CM

## 2016-12-30 DIAGNOSIS — G5601 Carpal tunnel syndrome, right upper limb: Secondary | ICD-10-CM

## 2016-12-30 MED ORDER — KETOROLAC TROMETHAMINE 60 MG/2ML IM SOLN
60.0000 mg | Freq: Once | INTRAMUSCULAR | Status: AC
  Administered 2016-12-30: 60 mg via INTRAMUSCULAR
  Filled 2016-12-30: qty 2

## 2016-12-30 MED ORDER — TIZANIDINE HCL 2 MG PO CAPS: 2.0000 mg | ORAL_CAPSULE | Freq: Every evening | ORAL | 0 refills | Status: DC | PRN

## 2016-12-30 MED ORDER — KETOROLAC TROMETHAMINE 30 MG/ML IJ SOLN
30.0000 mg | Freq: Once | INTRAMUSCULAR | Status: AC
  Administered 2016-12-30: 30 mg via INTRAMUSCULAR
  Filled 2016-12-30: qty 1

## 2016-12-30 NOTE — ED Provider Notes (Signed)
[facility] EMERGENCY DEPARTMENT Provider Note   CSN: [id number] Arrival date & time: 12/29/16  2157     History   Chief Complaint Chief Complaint  Patient presents with  . Hand Pain    HPI Katrina Vasquez is a 56 y.o. female.  Patient returns to the emergency department with complaint of bilateral upper extremity pain. She states she has a history of carpel tunnel and was seen for this on 12/27/16 in the emergency department. She states that now the pain is radiating up the right arm and causing neck pain. No weakness or numbness. She is on Pain Management for back pain and takes hydrocodone twice daily. This is not controlling her pain.    The history is provided by the patient. No language interpreter was used.    Past Medical History:  Diagnosis Date  . Cancer ([place])    cervical in remission  . Carpal tunnel syndrome   . Chronic pain   . Hypercholesteremia   . Hypertension   . 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 S/P hysterectomy 10/01/2014    Past Surgical History:  Procedure Laterality Date  . ABDOMINAL HYSTERECTOMY    . ABDOMINAL SURGERY    . CHOLECYSTECTOMY      OB History    No data available       Home Medications    Prior to Admission medications   Medication Sig Start Date End Date Taking? Authorizing Provider  acetaminophen (TYLENOL) 500 MG tablet Take 500-1,000 mg by mouth every 6 (six) hours as needed (for pain or headaches).     [provider]  amitriptyline (ELAVIL) 25 MG tablet Take 75 mg by mouth at bedtime.  07/22/15   Melburn Popper, MD  baclofen (LIORESAL) 10 MG tablet Take 10 mg by mouth 2 (two) times daily as needed for muscle spasms.     [provider]  DEXILANT 60 MG capsule Take 60 mg by mouth 2 (two) times daily.  03/02/16   [provider]  [place] 2 MG/0.4ML SOAJ  Call 911. Inject 1 auto-injector intramuscularly in shoulder or thigh. Repeat every 3 minutes as needed if no or minimal response. 11/03/16   [provider]  HYDROcodone-acetaminophen (NORCO) 10-325 MG per tablet Take 1 tablet by mouth 2 (two) times daily. Take up to 5 tablets every day     [provider]  [facility] ER 20 MG T24A Take 20 mg by mouth daily. 12/07/16   [provider]  ibuprofen (ADVIL,MOTRIN) 200 MG tablet Take 200-400 mg by mouth every 6 (six) hours as needed (for back pain).     [provider]  lisinopril-hydrochlorothiazide (PRINZIDE,ZESTORETIC) 20-25 MG tablet Take 1 tablet by mouth daily. 01/23/16   [provider]  naloxegol oxalate (MOVANTIK) 25 MG TABS tablet Take 25 mg by mouth daily.    [provider]  promethazine (PHENERGAN) 25 MG tablet Take 1 tablet (25 mg total) by mouth every 6 (six) hours as needed for nausea or vomiting. 06/22/16   Antonietta Breach, PA-C  rosuvastatin (CRESTOR) 10 MG tablet Take 10 mg by mouth daily.    [provider]  tizanidine (ZANAFLEX) 2 MG capsule Take 2 mg by mouth at bedtime as needed for muscle spasms.    [provider]  VENTOLIN HFA 108 (90 Base) MCG/ACT inhaler Inhale 2  puffs into the lungs 4 (four) times daily as needed for shortness of breath.  12/25/16   [provider]    Family History No family history on file.  Social History Social History   Tobacco Use  . Smoking status: Never Smoker  . Smokeless tobacco: Never Used  Substance Use Topics  . Alcohol use: No  . Drug use: No     Allergies   Percocet [oxycodone-acetaminophen]; Tramadol; and Vistaril [hydroxyzine hcl]   Review of Systems Review of Systems  Musculoskeletal: Positive for neck pain.       See HPI.  Skin: Negative.  Negative for color change.  Neurological: Negative.  Negative for weakness and numbness.     Physical Exam Updated Vital Signs BP (!) 159/94 (BP Location:  Left Arm)   Pulse 97   Temp 98.1 F (36.7 C) (Oral)   Resp 14   Ht 5\' 3"  (1.6 m)   Wt 61.2 kg (135 lb)   SpO2 98%   BMI 23.91 kg/m   Physical Exam  Constitutional: She is oriented to person, place, and time. She appears well-developed and well-nourished.  Neck: Normal range of motion.  Cardiovascular: Intact distal pulses.  Pulmonary/Chest: Effort normal.  Musculoskeletal:  UE's are unremarkable in appearance. There is no redness or swelling of any joint or muscle body. She has full and equal strength bilaterally. There is mild right paracervical tenderness, also with swelling or redness. Negative Phalen and Tinnel tests.  Neurological: She is alert and oriented to person, place, and time. No sensory deficit.  Skin: Skin is warm and dry.     ED Treatments / Results  Labs (all labs ordered are listed, but only abnormal results are displayed) Labs Reviewed - No data to display  EKG  EKG Interpretation None       Radiology No results found.  Procedures Procedures (including critical care time)  Medications Ordered in ED Medications - No data to display   Initial Impression / Assessment and Plan / ED Course  I have reviewed the triage vital signs and the nursing notes.  Pertinent labs & imaging results that were available during my care of the patient were reviewed by me and considered in my medical decision making (see chart for details).     Patient here for upper extremity pain, R>L, uncontrolled with chronic pain management at home. No new injury. No neurologic deficits. No evidence of infection.  IM Toradol provided. She is again encouraged to see her doctor for further pain control.   Final Clinical Impressions(s) / ED Diagnoses   Final diagnoses:  None   1. Bilateral UE pain  ED Discharge Orders    None       Charlann Lange, Hershal Coria 77/82/42 3536    Delora Fuel, MD 14/43/15 [phone]

## 2016-12-30 NOTE — ED Provider Notes (Signed)
[facility] DEPT Provider Note   CSN: [id number] Arrival date & time: 12/30/16  1536   History   Chief Complaint Chief Complaint  Patient presents with  . Arm Pain  . Neck Pain    HPI Katrina Vasquez is a 56 y.o. female.  HPI   56 year old female presents today with complaints of arm pain.  Patient is a significant past medical history of carpal tunnel syndrome.  She notes pain in bilateral arms with numbness in the hands.  She notes that she is now just having pain in the right wrist and hand no neurological deficits radiation up into her right shoulder and neck.  She reports pain is worse with movement, tightness in the right neck.  Patient reports that she has run out of her chronic pain medication, has been using ibuprofen as needed for discomfort.  Patient was seen in the emergency room yesterday, given the dose of her home pain medication and Toradol which she reports improved her symptoms.  Patient notes she is run out of her muscle relaxers as well.  She denies any chest pain or shortness of breath.  Past Medical History:  Diagnosis Date  . Cancer ([place])    cervical in remission  . Carpal tunnel syndrome   . Chronic pain   . Hypercholesteremia   . Hypertension   . 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 S/P hysterectomy 10/01/2014    Past Surgical History:  Procedure Laterality Date  . ABDOMINAL HYSTERECTOMY    . ABDOMINAL SURGERY    . CHOLECYSTECTOMY      OB History    No data available       Home Medications    Prior to Admission medications   Medication Sig Start Date End Date Taking? Authorizing Provider  acetaminophen (TYLENOL) 500 MG tablet Take 500-1,000 mg by mouth every 6 (six) hours as needed (for pain or headaches).     [provider]  amitriptyline (ELAVIL) 25 MG tablet Take 75 mg by mouth  at bedtime.  07/22/15   Melburn Popper, MD  baclofen (LIORESAL) 10 MG tablet Take 10 mg by mouth 2 (two) times daily as needed for muscle spasms.     [provider]  DEXILANT 60 MG capsule Take 60 mg by mouth 2 (two) times daily.  03/02/16   [provider]  EVZIO 2 MG/0.4ML SOAJ Call 911. Inject 1 auto-injector intramuscularly in shoulder or thigh. Repeat every 3 minutes as needed if no or minimal response. 11/03/16   [provider]  HYDROcodone-acetaminophen (NORCO) 10-325 MG per tablet Take 1 tablet by mouth 2 (two) times daily. Take up to 5 tablets every day     [provider]  [facility] ER 20 MG T24A Take 20 mg by mouth daily. 12/07/16   [provider]  ibuprofen (ADVIL,MOTRIN) 200 MG tablet Take 200-400 mg by mouth every 6 (six) hours as needed (for back pain).     [provider]  lisinopril-hydrochlorothiazide (PRINZIDE,ZESTORETIC) 20-25 MG tablet Take 1 tablet by mouth daily. 01/23/16   [provider]  naloxegol oxalate (MOVANTIK) 25 MG TABS tablet Take 25 mg by mouth daily.    [provider]  promethazine (PHENERGAN) 25 MG tablet Take 1 tablet (25 mg total) by mouth every 6 (six) hours as needed for nausea or vomiting. 06/22/16  Antonietta Breach, PA-C  rosuvastatin (CRESTOR) 10 MG tablet Take 10 mg by mouth daily.    [provider]  tizanidine (ZANAFLEX) 2 MG capsule Take 1 capsule (2 mg total) by mouth at bedtime as needed for muscle spasms. 12/30/16   Courtany Mcmurphy, Dellis Filbert, PA-C  VENTOLIN HFA 108 (90 Base) MCG/ACT inhaler Inhale 2 puffs into the lungs 4 (four) times daily as needed for shortness of breath.  12/25/16   [provider]    Family History No family history on file.  Social History Social History   Tobacco Use  . Smoking status: Never Smoker  . Smokeless tobacco: Never Used  Substance Use Topics  . Alcohol use: No  . Drug use: No     Allergies   Percocet  [oxycodone-acetaminophen]; Tramadol; and Vistaril [hydroxyzine hcl]   Review of Systems Review of Systems  All other systems reviewed and are negative.    Physical Exam Updated Vital Signs BP 132/90 (BP Location: Left Arm)   Pulse (!) 110   Temp 98.6 F (37 C) (Oral)   Resp 18   Ht 5\' 3"  (1.6 m)   Wt 61.2 kg (135 lb)   SpO2 100%   BMI 23.91 kg/m   Physical Exam  Constitutional: She is oriented to person, place, and time. She appears well-developed and well-nourished.  HENT:  Head: Normocephalic and atraumatic.  Eyes: Conjunctivae are normal. Pupils are equal, round, and reactive to light. Right eye exhibits no discharge. Left eye exhibits no discharge. No scleral icterus.  Neck: Normal range of motion. No JVD present. No tracheal deviation present.  Cardiovascular: Normal rate, regular rhythm and normal heart sounds.  Pulmonary/Chest: Effort normal. No stridor. No respiratory distress. She has no wheezes. She has no rales. She exhibits no tenderness.  Musculoskeletal:  Right upper extremity atraumatic no swelling or edema.  No significant tenderness to palpation of the hand, pain with flexion of the wrist, tenderness along the right trapezius pain with range of motion of the shoulder  Neurological: She is alert and oriented to person, place, and time. Coordination normal.  Psychiatric: She has a normal mood and affect. Her behavior is normal. Judgment and thought content normal.  Nursing note and vitals reviewed.    ED Treatments / Results  Labs (all labs ordered are listed, but only abnormal results are displayed) Labs Reviewed - No data to display  EKG  EKG Interpretation None       Radiology No results found.  Procedures Procedures (including critical care time)  Medications Ordered in ED Medications  ketorolac (TORADOL) 30 MG/ML injection 30 mg (not administered)     Initial Impression / Assessment and Plan / ED Course  I have reviewed the triage  vital signs and the nursing notes.  Pertinent labs & imaging results that were available during my care of the patient were reviewed by me and considered in my medical decision making (see chart for details).      Final Clinical Impressions(s) / ED Diagnoses   Final diagnoses:  Pain of right upper extremity  Carpal tunnel syndrome of right wrist    56 year old female presents today with chronic pain.  Patient is receiving narcotics on a regular basis, reports that she has run out.  She was seen yesterday for similar complaints.  This is likely muscular skeletal in nature.  Patient is not wearing a wrist splint she will be provided one here.  She was given a dose of Toradol here, muscle relaxers for home.  Patient does not have any chest pain or shortness of breath that would make me think this is referred pain.  Information given.  She verbalized understanding and agreement  ED Discharge Orders        Ordered    tizanidine (ZANAFLEX) 2 MG capsule  At bedtime PRN     12/30/16 1904       Francee Gentile 12/30/16 1904    Milton Ferguson, MD 12/30/16 2242

## 2016-12-30 NOTE — ED Triage Notes (Signed)
Patient here from home with complaints of right hand pain radiating up into right shoulder and neck x3 days. Reports taking Toradol with no relief.  States that this happen 10 year ago and was diagnosed with pinch nerve in neck. Denies n/v/d.

## 2016-12-30 NOTE — Discharge Instructions (Signed)
Please read attached information. If you experience any new or worsening signs or symptoms please return to the emergency room for evaluation. Please follow-up with your primary care provider or specialist as discussed. Please use medication prescribed only as directed and discontinue taking if you have any concerning signs or symptoms.   °

## 2017-04-16 IMAGING — CT CT ABD-PELV W/ CM
2 of 5 series · 10 of 46 positions shown, 11 images · IV contrast (Iodine)
Comparison: 09/17/2015.

CLINICAL DATA: Upper abdominal pain starting yesterday, history of
multiple surgeries for small bowel obstruction

EXAM:
CT ABDOMEN AND PELVIS WITH CONTRAST
TECHNIQUE: Multidetector CT imaging of the abdomen and pelvis was performed
using the standard protocol following bolus administration of
intravenous contrast.
CONTRAST:  100mL H4NEZM-CFF IOPAMIDOL (H4NEZM-CFF) INJECTION 61%

[Series 301: routine, idose (2) · axial · 0.80mm/px · z∈[+426,+796]mm · 7 of 94 slices shown, 8 images]
[im 10/94  soft-tissue]
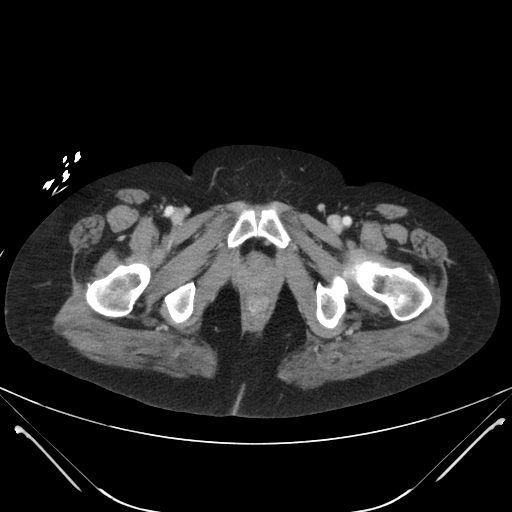
[im 10/94  bone]
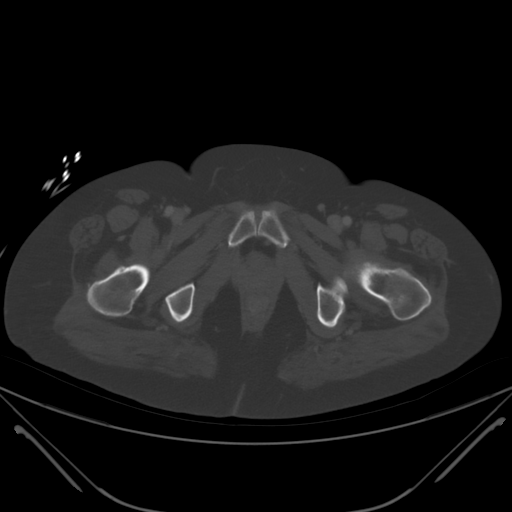
[im 24/94  soft-tissue]
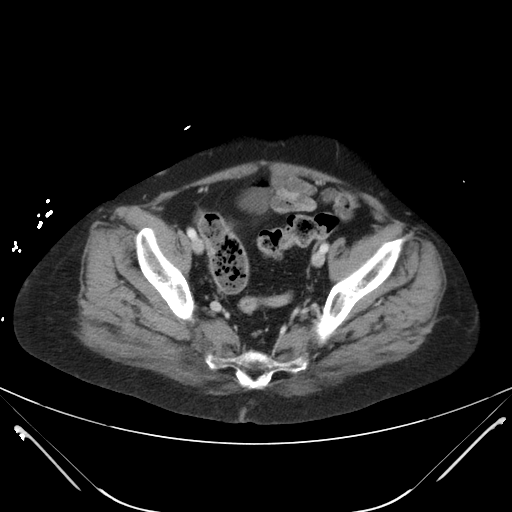
[im 33/94  soft-tissue]
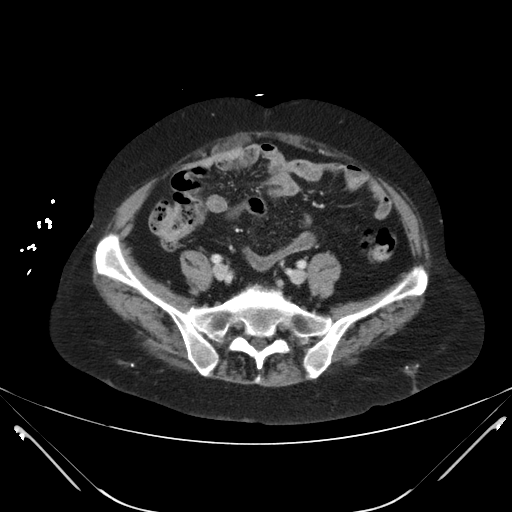
[im 47/94  soft-tissue]
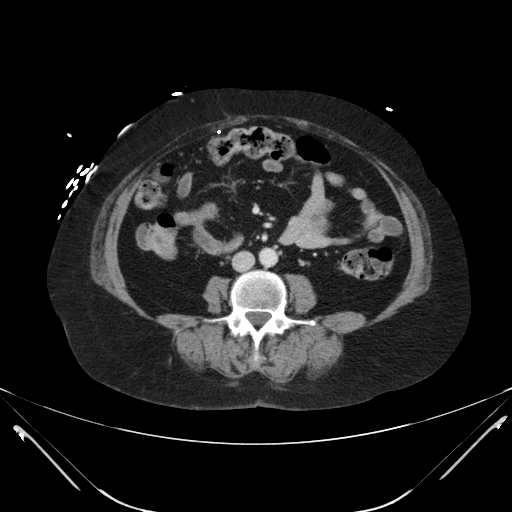
[im 61/94  soft-tissue]
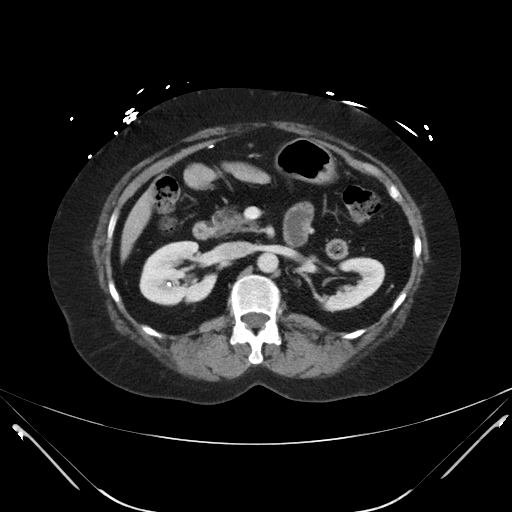
[im 70/94  soft-tissue]
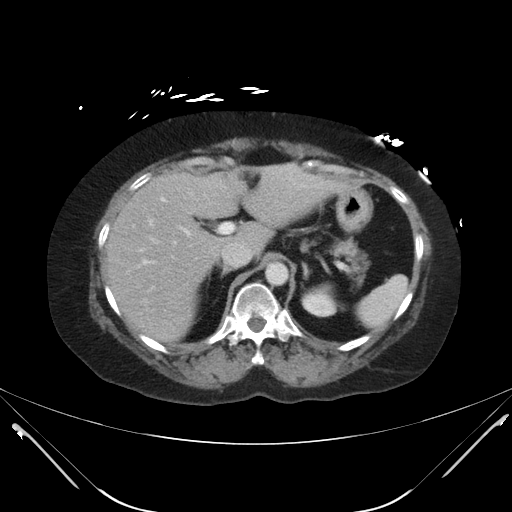
[im 84/94  soft-tissue]
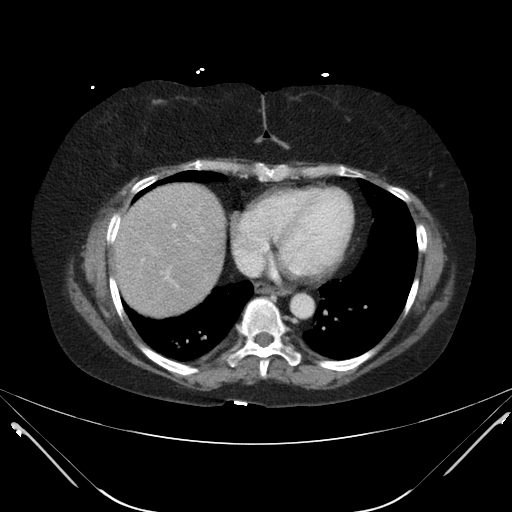

[Series 303: coronals, idose (2) · coronal · 0.45mm/px · 3 of 141 slices shown]
[im 47/141  soft-tissue]
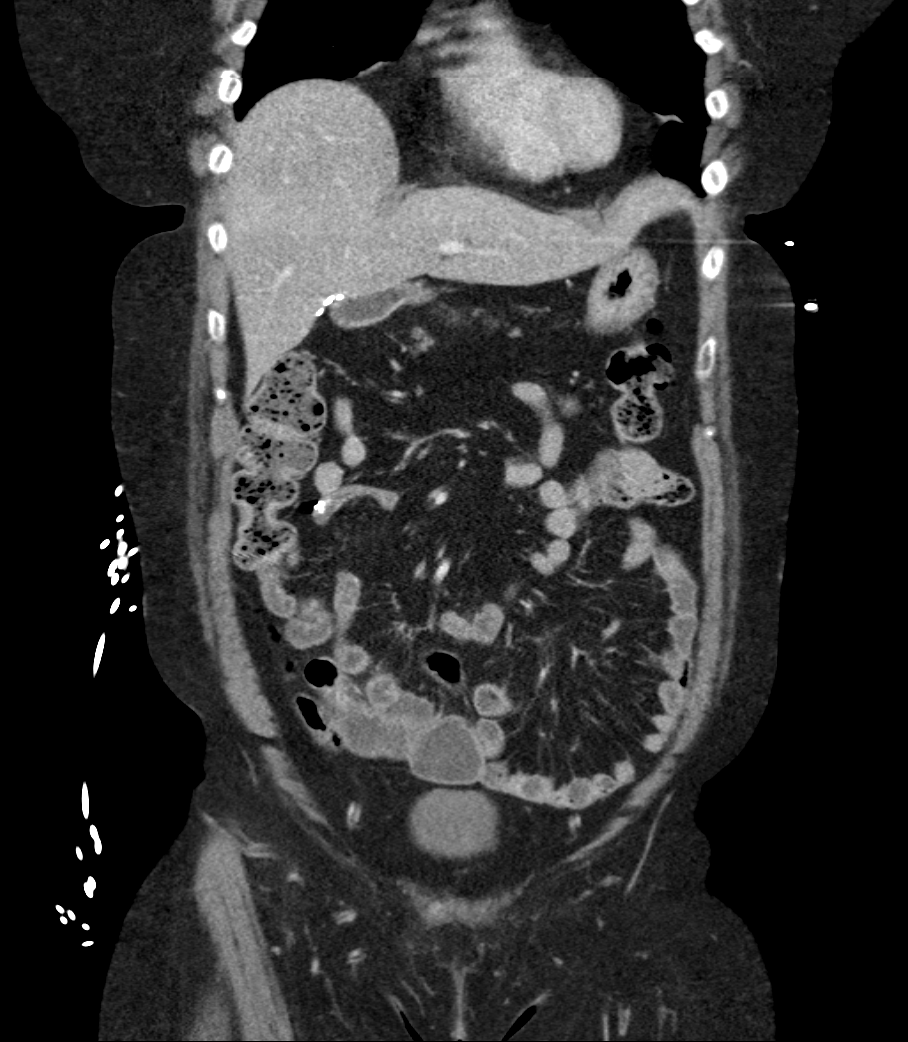
[im 63/141  soft-tissue]
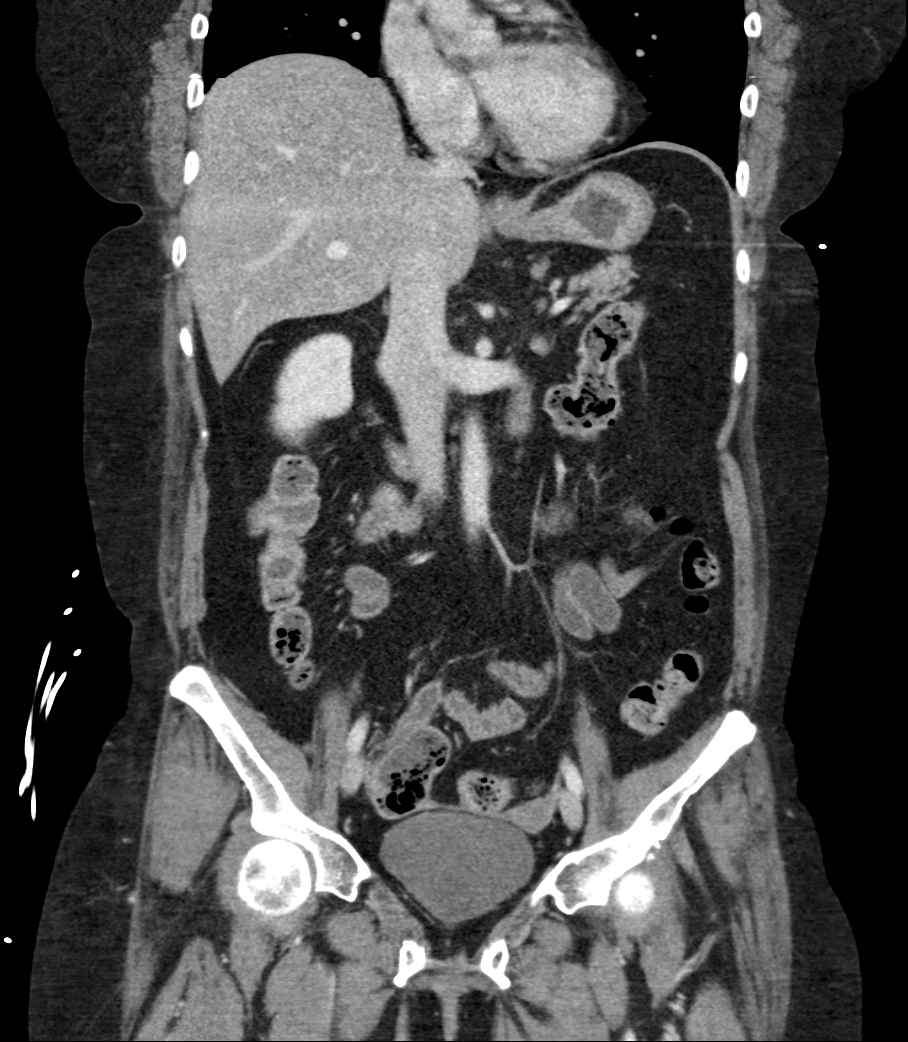
[im 78/141  soft-tissue]
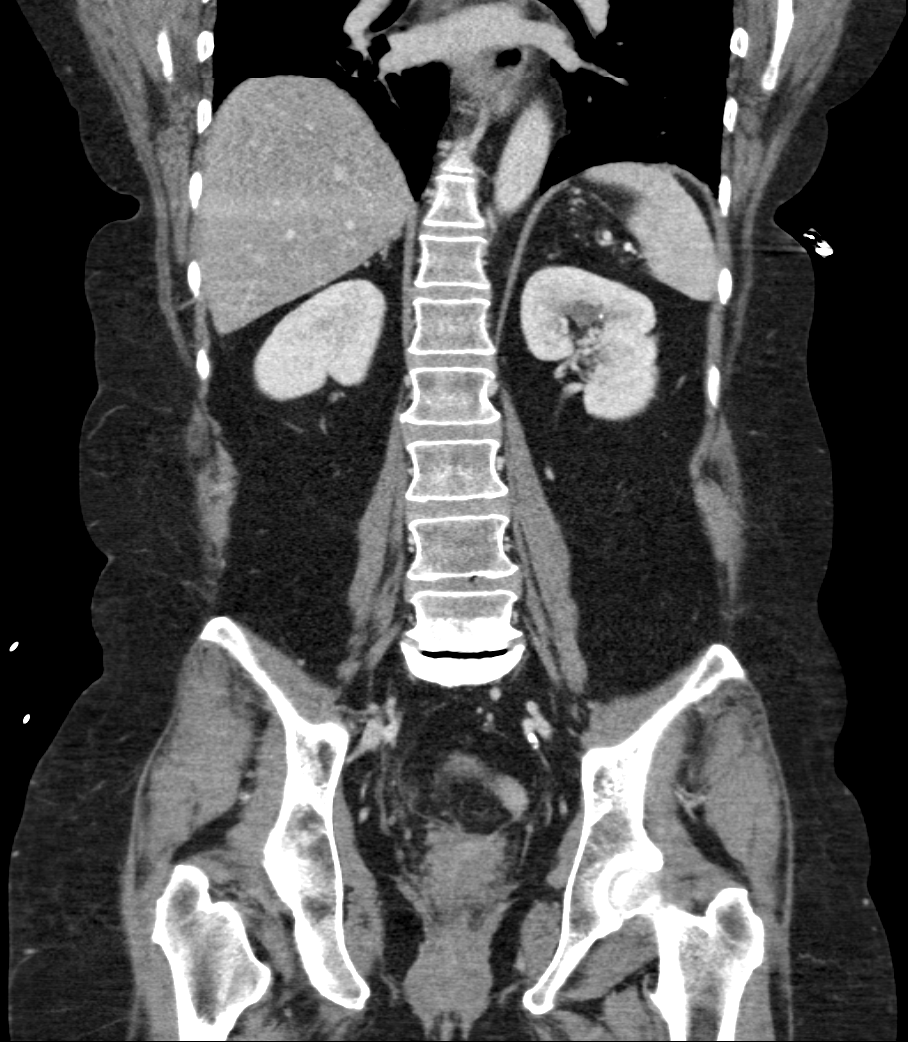

[10 of 46 positions shown; findings below may reference images not displayed]

FINDINGS: Lower chest: Lung bases shows atelectasis or early infiltrate in
right lower lobe posteriorly please see axial image 7.

Hepatobiliary: Enhanced liver shows no focal mass. The patient is
status post cholecystectomy. No intrahepatic biliary ductal
dilatation.

Pancreas: Enhanced pancreas without focal abnormality. No
peripancreatic inflammation.

Spleen: Enhanced spleen with normal appearance.

Adrenals/Urinary Tract: No adrenal gland mass. Enhanced kidneys are
symmetrical in size. No hydronephrosis or hydroureter.
Nonobstructive calculus in midpole of the right kidney measures
mm. Punctate nonobstructive calculus in upper pole of the left
kidney measures 2 mm. Delayed renal images shows bilateral renal
symmetrical excretion. Bilateral visualized proximal ureter is
unremarkable.

Stomach/Bowel: Small hiatal hernia again noted. No gastric outlet
obstruction. No small bowel obstruction. Stable minimal segmental
distension of small bowel in right abdomen axial image 67 probable
chronic in nature or postsurgical in nature. There is no small bowel
obstruction as fluid is noted in terminal ileum. No abrupt
transition in caliber of small bowel. Again noted low lying cecum.
There is some colonic stool within cecum. No pericecal inflammation.
The appendix is not identified. No evidence of colitis or
diverticulitis.

Vascular/Lymphatic: No aortic aneurysm.  No adenopathy.

Reproductive: The patient is status post hysterectomy. No pelvic
masses noted.

Other: No ascites or free abdominal air.

Musculoskeletal: No destructive bony lesions are noted. Degenerative
changes are noted lumbar spine at L5-S1 level. Significant disc
space flattening with vacuum disc phenomenon endplate sclerotic
changes at L5-S1 level. Mild to moderate posterior disc bulge at
L4-L5 level.
IMPRESSION: 1. There is small patchy atelectasis or early infiltrate in right
lower lobe posteriorly please see axial image 7. Clinical
correlation is necessary.
2. Status post cholecystectomy. No intrahepatic biliary ductal
dilatation.
3. No small bowel or colonic obstruction. No pericecal inflammation.
There is a low lying cecum. The appendix is not identified.
4. Status post hysterectomy.
5. Degenerative changes lumbar spine at L4-L5 and L5-S1 level.

## 2017-10-23 ENCOUNTER — Other Ambulatory Visit: Payer: Self-pay

## 2017-10-23 ENCOUNTER — Encounter (HOSPITAL_COMMUNITY): Payer: Self-pay | Admitting: Emergency Medicine

## 2017-10-23 ENCOUNTER — Emergency Department (HOSPITAL_COMMUNITY)
Admission: EM | Admit: 2017-10-23 | Discharge: 2017-10-24 | Disposition: A | Discharge status: Home / Self Care | Payer: Medicare Other | Attending: Emergency Medicine | Admitting: Emergency Medicine

## 2017-10-23 DIAGNOSIS — I10 Essential (primary) hypertension: Secondary | ICD-10-CM | POA: Diagnosis not present

## 2017-10-23 DIAGNOSIS — R112 Nausea with vomiting, unspecified: Secondary | ICD-10-CM | POA: Diagnosis not present

## 2017-10-23 DIAGNOSIS — R1110 Vomiting, unspecified: Secondary | ICD-10-CM | POA: Diagnosis present

## 2017-10-23 DIAGNOSIS — Z79899 Other long term (current) drug therapy: Secondary | ICD-10-CM | POA: Diagnosis not present

## 2017-10-23 LAB — URINALYSIS, ROUTINE W REFLEX MICROSCOPIC
BILIRUBIN URINE: NEGATIVE
Glucose, UA: NEGATIVE mg/dL
HGB URINE DIPSTICK: NEGATIVE
Ketones, ur: 20 mg/dL — AB
NITRITE: NEGATIVE
PROTEIN: 30 mg/dL — AB
Specific Gravity, Urine: 1.028 (ref 1.005–1.030)
pH: 5 (ref 5.0–8.0)

## 2017-10-23 LAB — COMPREHENSIVE METABOLIC PANEL
ALT: 20 U/L (ref 0–44)
AST: 18 U/L (ref 15–41)
Albumin: 4.3 g/dL (ref 3.5–5.0)
Alkaline Phosphatase: 81 U/L (ref 38–126)
Anion gap: 14 (ref 5–15)
BUN: 21 mg/dL — ABNORMAL HIGH (ref 6–20)
CHLORIDE: 97 mmol/L — AB (ref 98–111)
CO2: 29 mmol/L (ref 22–32)
Calcium: 10.8 mg/dL — ABNORMAL HIGH (ref 8.9–10.3)
Creatinine, Ser: 1.1 mg/dL — ABNORMAL HIGH (ref 0.44–1.00)
GFR, EST NON AFRICAN AMERICAN: 55 mL/min — AB (ref >60)
Glucose, Bld: 126 mg/dL — ABNORMAL HIGH (ref 70–99)
POTASSIUM: 3.5 mmol/L (ref 3.5–5.1)
Sodium: 140 mmol/L (ref 135–145)
TOTAL PROTEIN: 7.8 g/dL (ref 6.5–8.1)
Total Bilirubin: 0.8 mg/dL (ref 0.3–1.2)

## 2017-10-23 LAB — CBC
HEMATOCRIT: 42.5 % (ref 36.0–46.0)
Hemoglobin: 13.2 g/dL (ref 12.0–15.0)
MCH: 28.5 pg (ref 26.0–34.0)
MCHC: 31.1 g/dL (ref 30.0–36.0)
MCV: 91.8 fL (ref 78.0–100.0)
Platelets: 275 10*3/uL (ref 150–400)
RBC: 4.63 MIL/uL (ref 3.87–5.11)
RDW: 13.2 % (ref 11.5–15.5)
WBC: 9.4 10*3/uL (ref 4.0–10.5)

## 2017-10-23 LAB — LIPASE, BLOOD: LIPASE: 22 U/L (ref 11–51)

## 2017-10-23 NOTE — ED Triage Notes (Signed)
Pt reports vomiting x 3 days. Pt reports she is unable to keep anything down. Pt reports she has tried phenergan with no relief. Pt denies changes in urination, diarrhea or vaginal discharge/bleeding.

## 2017-10-24 MED ORDER — GI COCKTAIL ~~LOC~~
30.0000 mL | Freq: Once | ORAL | Status: AC
  Administered 2017-10-24: 30 mL via ORAL
  Filled 2017-10-24: qty 30

## 2017-10-24 MED ORDER — METOCLOPRAMIDE HCL 5 MG/ML IJ SOLN
10.0000 mg | Freq: Once | INTRAMUSCULAR | Status: AC
  Administered 2017-10-24: 10 mg via INTRAVENOUS
  Filled 2017-10-24: qty 2

## 2017-10-24 MED ORDER — ONDANSETRON 4 MG PO TBDP: 4.0000 mg | ORAL_TABLET | Freq: Three times a day (TID) | ORAL | 0 refills | Status: AC | PRN

## 2017-10-24 MED ORDER — SODIUM CHLORIDE 0.9 % IV BOLUS
1000.0000 mL | Freq: Once | INTRAVENOUS | Status: AC
  Administered 2017-10-24: 1000 mL via INTRAVENOUS

## 2017-10-24 NOTE — Discharge Instructions (Addendum)

## 2017-10-24 NOTE — ED Provider Notes (Signed)
[facility]ERG[facility] DEPARTMENT Provider Note  CSN: [id number] Arrival date & time: 10/23/17 2050  Chief Complaint(s) Emesis  HPI Katrina Vasquez is a 57 y.o. female   The history is provided by the patient.  Emesis   This is a recurrent problem. Episode onset: 4 days. The problem occurs 5 to 10 times per day. The problem has not changed since onset.The emesis has an appearance of stomach contents. There has been no fever. Pertinent negatives include no abdominal pain, no chills, no cough, no diarrhea, no fever, no headaches and no URI. Risk factors: previous SBO.   Patient denies any urinary symptoms.  Feels similar to previous gastritis flares. Taking Phenergan, but not helping her chronic nausea and emesis.  No BM in 3 days but is passing gas. States that this does not feel like prior SBO.  Past Medical History Past Medical History:  Diagnosis Date  . Cancer ([place])    cervical in remission  . Carpal tunnel syndrome   . Chronic pain   . Hypercholesteremia   . Hypertension   . 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 S/P hysterectomy 10/01/2014   Home Medication(s) Prior to Admission medications   Medication Sig Start Date End Date Taking? Authorizing Provider  acetaminophen (TYLENOL) 500 MG tablet Take 500-1,000 mg by mouth every 6 (six) hours as needed (for pain or headaches).     [provider]  amitriptyline (ELAVIL) 25 MG tablet Take 75 mg by mouth at bedtime.  07/22/15   Melburn Popper, MD  baclofen (LIORESAL) 10 MG tablet Take 10 mg by mouth 2 (two) times daily as needed for muscle spasms.     [provider]  DEXILANT 60 MG capsule Take 60 mg by mouth 2 (two) times daily.  03/02/16   [provider]  EVZIO 2 MG/0.4ML SOAJ Call 911. Inject 1 auto-injector intramuscularly in shoulder or thigh. Repeat every 3  minutes as needed if no or minimal response. 11/03/16   [provider]  HYDROcodone-acetaminophen (NORCO) 10-325 MG per tablet Take 1 tablet by mouth 2 (two) times daily. Take up to 5 tablets every day     [provider]  [facility] ER 20 MG T24A Take 20 mg by mouth daily. 12/07/16   [provider]  ibuprofen (ADVIL,MOTRIN) 200 MG tablet Take 200-400 mg by mouth every 6 (six) hours as needed (for back pain).     [provider]  lisinopril-hydrochlorothiazide (PRINZIDE,ZESTORETIC) 20-25 MG tablet Take 1 tablet by mouth daily. 01/23/16   [provider]  naloxegol oxalate (MOVANTIK) 25 MG TABS tablet Take 25 mg by mouth daily.    [provider]  ondansetron (ZOFRAN ODT) 4 MG disintegrating tablet Take 1 tablet (4 mg total) by mouth every 8 (eight) hours as needed for up to 3 days for nausea or vomiting (Take this if Phenergan is not helping). 10/24/17 10/27/17  Fatima Blank, MD  promethazine (PHENERGAN) 25 MG tablet Take 1 tablet (25 mg total) by mouth every 6 (six) hours as needed for nausea or vomiting. 06/22/16   Antonietta Breach, PA-C  rosuvastatin (CRESTOR) 10 MG tablet Take 10 mg by mouth daily.    [provider]  tizanidine (ZANAFLEX) 2 MG capsule Take 1 capsule (2 mg total) by mouth at bedtime as needed for muscle spasms. 12/30/16   Hedges,  Dellis Filbert, PA-C  VENTOLIN HFA 108 (90 Base) MCG/ACT inhaler Inhale 2 puffs into the lungs 4 (four) times daily as needed for shortness of breath.  12/25/16   [provider]                                                                                                                                    Past Surgical History Past Surgical History:  Procedure Laterality Date  . ABDOMINAL HYSTERECTOMY    . ABDOMINAL SURGERY    . APPENDECTOMY    . CHOLECYSTECTOMY     Family History No family history on file.  Social History Social History   Tobacco Use  . Smoking status: Never  Smoker  . Smokeless tobacco: Never Used  Substance Use Topics  . Alcohol use: No  . Drug use: No   Allergies Percocet [oxycodone-acetaminophen]; Tramadol; and Vistaril [hydroxyzine hcl]  Review of Systems Review of Systems  Constitutional: Negative for chills and fever.  Respiratory: Negative for cough.   Gastrointestinal: Positive for vomiting. Negative for abdominal pain and diarrhea.  Neurological: Negative for headaches.   All other systems are reviewed and are negative for acute change except as noted in the HPI  Physical Exam Vital Signs  I have reviewed the triage vital signs BP 133/89 (BP Location: Left Arm)   Pulse 92   Temp 99.6 F (37.6 C)   Resp 16   SpO2 99%   Physical Exam  Constitutional: She is oriented to person, place, and time. She appears well-developed and well-nourished. No distress.  HENT:  Head: Normocephalic and atraumatic.  Right Ear: External ear normal.  Left Ear: External ear normal.  Nose: Nose normal.  Eyes: Conjunctivae and EOM are normal. No scleral icterus.  Neck: Normal range of motion and phonation normal.  Cardiovascular: Normal rate and regular rhythm.  Pulmonary/Chest: Effort normal. No stridor. No respiratory distress.  Abdominal: She exhibits no distension. There is generalized tenderness (discomfort). There is no rigidity, no rebound and no guarding.  Musculoskeletal: Normal range of motion. She exhibits no edema.  Neurological: She is alert and oriented to person, place, and time.  Skin: She is not diaphoretic.  Psychiatric: She has a normal mood and affect. Her behavior is normal.  Vitals reviewed.   ED Results and Treatments Labs (all labs ordered are listed, but only abnormal results are displayed) Labs Reviewed  COMPREHENSIVE METABOLIC PANEL - Abnormal; Notable for the following components:      Result Value   Chloride 97 (*)    Glucose, Bld 126 (*)    BUN 21 (*)    Creatinine, Ser 1.10 (*)    Calcium 10.8 (*)     GFR calc non Af Amer 55 (*)    All other components within normal limits  URINALYSIS, ROUTINE W REFLEX MICROSCOPIC - Abnormal; Notable for the following components:   APPearance HAZY (*)    Ketones, ur 20 (*)  Protein, ur 30 (*)    Leukocytes, UA LARGE (*)    WBC, UA >50 (*)    Bacteria, UA RARE (*)    All other components within normal limits  URINE CULTURE  LIPASE, BLOOD  CBC                                                                                                                         EKG  EKG Interpretation  Date/Time:    Ventricular Rate:    PR Interval:    QRS Duration:   QT Interval:    QTC Calculation:   R Axis:     Text Interpretation:        Radiology No results found. Pertinent labs & imaging results that were available during my care of the patient were reviewed by me and considered in my medical decision making (see chart for details).  Medications Ordered in ED Medications  sodium chloride 0.9 % bolus 1,000 mL (0 mLs Intravenous Stopped 10/24/17 0150)  metoCLOPramide (REGLAN) injection 10 mg (10 mg Intravenous Given 10/24/17 0032)  gi cocktail (Maalox,Lidocaine,Donnatal) (30 mLs Oral Given 10/24/17 0034)                                                                                                                                    Procedures Procedures  (including critical care time)  Medical Decision Making / ED Course I have reviewed the nursing notes for this encounter and the patient's prior records (if available in EHR or on provided paperwork).    Patient with several days of intractable nausea and nonbloody nonbilious emesis.  Abdomen with diffuse abdominal discomfort but not peritonitic.  Labs grossly reassuring without leukocytosis or anemia.  No significant electrolyte derangements.  Mild renal insufficiency, likely due to dehydration given emesis.  No evidence of biliary obstruction or pancreatitis.  UA with leukocytes and WBCs but  patient is adamant about not having any urinary symptoms.  After IV fluids and nausea medication patient's symptomatology completely resolved.  She was able to tolerate oral hydration.  Repeat abdominal exam without any discomfort.  Doubt small bowel obstruction or other serious intra-abdominal inflammatory/infectious process requiring imaging at this time.  We will send urine for culture, and hold antibiotics at this time.  The patient appears reasonably screened and/or stabilized for discharge and I doubt any other medical condition or other [facility] requiring further screening,  evaluation, or treatment in the ED at this time prior to discharge.  The patient is safe for discharge with strict return precautions.   Final Clinical Impression(s) / ED Diagnoses Final diagnoses:  Non-intractable vomiting with nausea, unspecified vomiting type    Disposition: Discharge  Condition: Good  I have discussed the results, Dx and Tx plan with the patient who expressed understanding and agree(s) with the plan. Discharge instructions discussed at great length. The patient was given strict return precautions who verbalized understanding of the instructions. No further questions at time of discharge.    ED Discharge Orders         Ordered    ondansetron (ZOFRAN ODT) 4 MG disintegrating tablet  Every 8 hours PRN     10/24/17 0229           Follow Up: Nolene Ebbs, MD [place] [id number] [phone]  Schedule an appointment as soon as possible for a visit  As needed     This chart was dictated using voice recognition software.  Despite best efforts to proofread,  errors can occur which can change the documentation meaning.   Fatima Blank, MD 10/24/17 [phone]

## 2019-06-18 ENCOUNTER — Other Ambulatory Visit: Payer: Self-pay | Admitting: Internal Medicine

## 2019-06-18 DIAGNOSIS — E2839 Other primary ovarian failure: Secondary | ICD-10-CM

## 2019-09-02 ENCOUNTER — Other Ambulatory Visit: Payer: Self-pay

## 2019-09-02 ENCOUNTER — Other Ambulatory Visit: Payer: Self-pay | Admitting: Internal Medicine

## 2019-09-02 ENCOUNTER — Ambulatory Visit
Admission: RE | Admit: 2019-09-02 | Discharge: 2019-09-02 | Disposition: A | Discharge status: Home / Self Care | Payer: Medicare Other | Source: Ambulatory Visit | Attending: Internal Medicine | Admitting: Internal Medicine

## 2019-09-02 DIAGNOSIS — Z1231 Encounter for screening mammogram for malignant neoplasm of breast: Secondary | ICD-10-CM

## 2019-09-02 DIAGNOSIS — E2839 Other primary ovarian failure: Secondary | ICD-10-CM

## 2019-10-07 ENCOUNTER — Other Ambulatory Visit: Payer: Self-pay | Admitting: Pain Medicine

## 2019-10-07 DIAGNOSIS — M545 Low back pain, unspecified: Secondary | ICD-10-CM

## 2019-10-07 DIAGNOSIS — M79604 Pain in right leg: Secondary | ICD-10-CM

## 2019-10-25 ENCOUNTER — Other Ambulatory Visit: Payer: Self-pay

## 2019-10-25 ENCOUNTER — Ambulatory Visit
Admission: RE | Admit: 2019-10-25 | Discharge: 2019-10-25 | Disposition: A | Discharge status: Home / Self Care | Payer: Medicare Other | Source: Ambulatory Visit | Attending: Pain Medicine | Admitting: Pain Medicine

## 2019-10-25 DIAGNOSIS — M545 Low back pain, unspecified: Secondary | ICD-10-CM

## 2019-10-25 DIAGNOSIS — M79604 Pain in right leg: Secondary | ICD-10-CM

## 2019-12-26 ENCOUNTER — Other Ambulatory Visit: Payer: Self-pay | Admitting: Orthopedic Surgery

## 2019-12-26 DIAGNOSIS — M545 Low back pain, unspecified: Secondary | ICD-10-CM

## 2020-02-02 ENCOUNTER — Other Ambulatory Visit: Payer: Self-pay

## 2020-02-02 ENCOUNTER — Ambulatory Visit
Admission: RE | Admit: 2020-02-02 | Discharge: 2020-02-02 | Disposition: A | Discharge status: Home / Self Care | Payer: Medicare Other | Source: Ambulatory Visit | Attending: Orthopedic Surgery | Admitting: Orthopedic Surgery

## 2020-02-02 DIAGNOSIS — M545 Low back pain, unspecified: Secondary | ICD-10-CM

## 2020-05-14 ENCOUNTER — Other Ambulatory Visit: Payer: Self-pay | Admitting: Orthopedic Surgery

## 2020-05-18 ENCOUNTER — Other Ambulatory Visit: Payer: Self-pay

## 2020-05-20 ENCOUNTER — Other Ambulatory Visit: Payer: Self-pay

## 2020-05-31 ENCOUNTER — Encounter: Payer: Self-pay | Admitting: Vascular Surgery

## 2020-05-31 ENCOUNTER — Ambulatory Visit: Payer: Medicare Other | Admitting: Vascular Surgery

## 2020-05-31 ENCOUNTER — Other Ambulatory Visit: Payer: Self-pay

## 2020-05-31 VITALS — BP 124/85 | HR 97 | Temp 97.2°F | Resp 16 | Ht 63.0 in | Wt 139.6 lb

## 2020-05-31 DIAGNOSIS — M5137 Other intervertebral disc degeneration, lumbosacral region: Secondary | ICD-10-CM | POA: Diagnosis not present

## 2020-05-31 NOTE — Progress Notes (Signed)
[facility]  Patient name: Katrina Vasquez MRN: [id number] DOB: 09-21-1960 Sex: female  REASON FOR CONSULT: Discuss anterior exposure for L4-5 and L5-S1 disc fusion with Dr. Lynann Bologna  HPI: Katrina Vasquez is a 60 y.o. female, who is here today for evaluation.  Has had progressively severe back pain for over 10 years.  Initially was having some response from spine injections but reports this is no longer effective.  She has pain in her low back and this extends down into both legs as well.  She has been seen in consultation with Dr. Lynann Bologna and he is recommended anterior and posterior fusion.  She is here today for discussion of my role in surgery.  She has had extensive prior abdominal surgery.  This is dating back into the mid 77s.  Apparently initially she had a ruptured appendix and had what sounds like bowel involvement with infection and underwent resection.  She is also had hysterectomy from a low Pfannenstiel incision and also has had a laparoscopic cholecystectomy.  She has no history of peripheral vascular disease and no history of cardiac disease  Past Medical History:  Diagnosis Date  . Back pain   . Cancer ([place])    cervical in remission  . Carpal tunnel syndrome   . Chronic pain   . Hypercholesteremia   . Hypertension   . Leg pain    Bilateral  . 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 Kidney disease Mother   . Arthritis Mother   . Heart disease Father     SOCIAL HISTORY: Social History   Socioeconomic History  . Marital status: Widowed    Spouse name: Not on file  . Number of children: Not on file  . Years of education: Not on file  . Highest education level: Not on file  Occupational History  . Not on file  Tobacco Use  . Smoking status: Never Smoker  . Smokeless tobacco: Never Used  Vaping Use  . Vaping Use: Never used  Substance  and Sexual Activity  . Alcohol use: No  . Drug use: No  . Sexual activity: Yes    Birth control/protection: Surgical  Other Topics Concern  . Not on file  Social History Narrative  . Not on file   Social Determinants of Health   Financial Resource Strain: Not on file  Food Insecurity: Not on file  Transportation Needs: Not on file  Physical Activity: Not on file  Stress: Not on file  Social Connections: Not on file  Intimate Partner Violence: Not on file    Allergies  Allergen Reactions  . Percocet [Oxycodone-Acetaminophen] Nausea And Vomiting  . Hydroxyzine Hcl Itching  . Tramadol Nausea And Vomiting    Current Outpatient Medications  Medication Sig Dispense Refill  . acetaminophen (TYLENOL) 500 MG tablet Take 500-1,000 mg by mouth every 6 (six) hours as needed for moderate pain or headache.    Marland Kitchen amitriptyline (ELAVIL) 25 MG tablet Take 75 mg by mouth at bedtime.    . baclofen (LIORESAL) 10 MG tablet Take 10 mg by mouth 3 (three) times daily.    Marland Kitchen HYDROcodone-acetaminophen (NORCO) 7.5-325 MG tablet Take 1 tablet by mouth 4 (four) times daily.    Marland Kitchen HYSINGLA ER 60 MG T24A Take 60 mg by mouth daily.    Marland Kitchen lisinopril-hydrochlorothiazide (PRINZIDE,ZESTORETIC) 20-25 MG tablet Take 1 tablet by mouth daily.    Marland Kitchen  lubiprostone (AMITIZA) 24 MCG capsule Take 24 mcg by mouth 2 (two) times daily.    . pregabalin (LYRICA) 75 MG capsule Take 75 mg by mouth at bedtime.    . promethazine (PHENERGAN) 25 MG tablet Take 25 mg by mouth in the morning, at noon, and at bedtime.    . rosuvastatin (CRESTOR) 10 MG tablet Take 10 mg by mouth daily.    . sucralfate (CARAFATE) 1 g tablet Take 1 g by mouth 2 (two) times daily.    Marland Kitchen tiZANidine (ZANAFLEX) 2 MG tablet Take 2 mg by mouth 2 (two) times daily as needed for muscle spasms.    . VENTOLIN HFA 108 (90 Base) MCG/ACT inhaler Inhale 2 puffs into the lungs 4 (four) times daily as needed for shortness of breath.   2   No current facility-administered  medications for this visit.    REVIEW OF SYSTEMS:  [X]  denotes positive finding, [ ]  denotes negative finding Cardiac  Comments:  Chest pain or chest pressure:    Shortness of breath upon exertion:    Short of breath when lying flat:    Irregular heart rhythm:        Vascular    Pain in calf, thigh, or hip brought on by ambulation: x  neurogenic  Pain in feet at night that wakes you up from your sleep:     Blood clot in your veins:    Leg swelling:         Pulmonary    Oxygen at home:    Productive cough:     Wheezing:         Neurologic    Sudden weakness in arms or legs:     Sudden numbness in arms or legs:     Sudden onset of difficulty speaking or slurred speech:    Temporary loss of vision in one eye:     Problems with dizziness:         Gastrointestinal    Blood in stool:     Vomited blood:         Genitourinary    Burning when urinating:     Blood in urine:        Psychiatric    Major depression:         Hematologic    Bleeding problems:    Problems with blood clotting too easily:        Skin    Rashes or ulcers:        Constitutional    Fever or chills:      PHYSICAL EXAM: Vitals:   05/31/20 1108  BP: 124/85  Pulse: 97  Resp: 16  Temp: (!) 97.2 F (36.2 C)  SpO2: 95%  Weight: 139 lb 9.6 oz (63.3 kg)  Height: 5\' 3"  (1.6 m)  PF: (!) 6 L/min    GENERAL: The patient is a well-nourished female, in no acute distress. The vital signs are documented above. CARDIOVASCULAR: 2+ radial 2+ femoral and 2+ dorsalis pedis pulses bilaterally Abdomen soft and nontender.  Multiple prior scars both in the midline and also low Pfannenstiel.  I do not feel any hernias PULMONARY: There is good air exchange  MUSCULOSKELETAL: There are no major deformities or cyanosis. NEUROLOGIC: No focal weakness or paresthesias are detected. SKIN: There are no ulcers or rashes noted. PSYCHIATRIC: The patient has a normal affect.  DATA:  I reviewed her recent lumbar CT and  also CT of her abdomen and pelvis for 2018.  This  shows normal location of her aortic and venous bifurcation over the spine.  Minimal atherosclerotic changes  MEDICAL ISSUES: Had long discussion with my role in surgery.  Explained that Dr. Lynann Bologna is recommended surgery for improvement in her back pain.  I discussed left paramedian incision for 2 level exposure.  I discussed mobilization of the rectus muscle and the retroperitoneal exposure.  Also discussed mobile mobilization of her left ureter and mobilization of the arterial and venous structures overlying the spine.  I discussed the potential injury to all these in particular emphasized the risk of major venous injury.  She has had prior discectomy at the 4 5 and 5 1 level which may make some additional scarring.  I feel that she is an acceptable risk for surgery She has had multiple prior abdominal surgeries and discussed that this may make mobilization more difficult but should not preclude surgery She is not obese She has no evidence of severe  atherosclerotic change  Plan for surgery on [phone] at [facility].   Rosetta Posner, MD [facility] [phone] Pager [phone]  Note: Portions of this report may have been transcribed using voice recognition software.  Every effort has been made to ensure accuracy; however, inadvertent computerized transcription errors may still be present.

## 2020-06-01 ENCOUNTER — Encounter (HOSPITAL_COMMUNITY): Payer: Self-pay

## 2020-06-01 NOTE — Progress Notes (Signed)
Surgical Instructions    Your procedure is scheduled on Wednesday May 18  Report to [facility] Main Entrance "A" at 5:30 A.M., then check in with the Admitting office.  Call this number if you have problems the morning of surgery:  [phone]   If you have any questions prior to your surgery date call [phone]: Open Monday-Friday 8am-4pm    Remember:  Do not eat after midnight the night before your surgery  You may drink clear liquids until 5:30 AM the morning of your surgery.   Clear liquids allowed are: Water, Non-Citrus Juices (without pulp), Carbonated Beverages, Clear Tea, Black Coffee Only, and Gatorade Patient Instructions  . The night before surgery:  o No food after midnight. ONLY clear liquids after midnight  . The day of surgery (if you do NOT have diabetes):  o Drink ONE (1) Pre-Surgery Clear Ensure by 5:30 AM the morning of surgery. Drink in one sitting. Do not sip.  o This drink was given to you during your hospital  pre-op appointment visit.  o Nothing else to drink after completing the  Pre-Surgery Clear Ensure.         If you have questions, please contact your surgeon's office.     Take these medicines the morning of surgery with A SIP OF WATER   amitriptyline (ELAVIL) ppromethazine (PHENERGAN) 25  regabalin (LYRICA) rosuvastatin (CRESTOR) sucralfate (CARAFATE)   Take if Needed the following: acetaminophen (TYLENOL) baclofen (LIORESAL) HYDROcodone-acetaminophen HYSINGLA ER tiZANidine (ZANAFLEX) VENTOLIN HFA inhaler.  Please bring your inhalers with you the day of Surgery.   As of today, STOP taking any Aspirin (unless otherwise instructed by your surgeon) Aleve, Naproxen, Ibuprofen, Motrin, Advil, Goody's, BC's, all herbal medications, fish oil, and all vitamins.                     Do not wear jewelry, make up, or nail polish            Do not wear lotions, powders, perfumes, or deodorant.            Do not shave 48 hours prior to surgery.   Men may shave face and neck.            Do not bring valuables to the hospital.            [facility] is not responsible for any belongings or valuables.  Do NOT Smoke (Tobacco/Vaping) or drink Alcohol 24 hours prior to your procedure If you use a CPAP at night, you may bring all equipment for your overnight stay.   Contacts, glasses, dentures or bridgework may not be worn into surgery, please bring cases for these belongings   For patients admitted to the hospital, discharge time will be determined by your treatment team.   Patients discharged the day of surgery will not be allowed to drive home, and someone needs to stay with them for 24 hours.    Special instructions:   - Preparing For Surgery  Before surgery, you can play an important role. Because skin is not sterile, your skin needs to be as free of germs as possible. You can reduce the number of germs on your skin by washing with CHG (chlorahexidine gluconate) Soap before surgery.  CHG is an antiseptic cleaner which kills germs and bonds with the skin to continue killing germs even after washing.    Oral Hygiene is also important to reduce your risk of infection.  Remember - BRUSH YOUR  TEETH THE MORNING OF SURGERY WITH YOUR REGULAR TOOTHPASTE  Please do not use if you have an allergy to CHG or antibacterial soaps. If your skin becomes reddened/irritated stop using the CHG.  Do not shave (including legs and underarms) for at least 48 hours prior to first CHG shower. It is OK to shave your face.  Please follow these instructions carefully.   1. Shower the NIGHT BEFORE SURGERY and the MORNING OF SURGERY  2. If you chose to wash your hair, wash your hair first as usual with your normal shampoo.  3. After you shampoo, rinse your hair and body thoroughly to remove the shampoo.  4. Wash Face and genitals (private parts) with your normal soap.   5.  Shower the NIGHT BEFORE SURGERY and the MORNING OF SURGERY with CHG  Soap.   6. Use CHG Soap as you would any other liquid soap. You can apply CHG directly to the skin and wash gently with a scrungie or a clean washcloth.   7. Apply the CHG Soap to your body ONLY FROM THE NECK DOWN.  Do not use on open wounds or open sores. Avoid contact with your eyes, ears, mouth and genitals (private parts). Wash Face and genitals (private parts)  with your normal soap.   8. Wash thoroughly, paying special attention to the area where your surgery will be performed.  9. Thoroughly rinse your body with warm water from the neck down.  10. DO NOT shower/wash with your normal soap after using and rinsing off the CHG Soap.  11. Pat yourself dry with a CLEAN TOWEL.  12. Wear CLEAN PAJAMAS to bed the night before surgery  13. Place CLEAN SHEETS on your bed the night before your surgery  14. DO NOT SLEEP WITH PETS.   Day of Surgery: Take a shower with CHG soap. Wear Clean/Comfortable clothing the morning of surgery Do not apply any deodorants/lotions.   Remember to brush your teeth WITH YOUR REGULAR TOOTHPASTE.   Please read over the following fact sheets that you were given.

## 2020-06-02 ENCOUNTER — Encounter (HOSPITAL_COMMUNITY)
Admission: RE | Admit: 2020-06-02 | Discharge: 2020-06-02 | Disposition: A | Discharge status: Home / Self Care | Payer: Medicare Other | Source: Ambulatory Visit | Attending: Orthopedic Surgery | Admitting: Orthopedic Surgery

## 2020-06-02 ENCOUNTER — Encounter (HOSPITAL_COMMUNITY): Payer: Self-pay

## 2020-06-02 ENCOUNTER — Other Ambulatory Visit: Payer: Self-pay

## 2020-06-02 DIAGNOSIS — I10 Essential (primary) hypertension: Secondary | ICD-10-CM | POA: Diagnosis not present

## 2020-06-02 DIAGNOSIS — Z79899 Other long term (current) drug therapy: Secondary | ICD-10-CM | POA: Insufficient documentation

## 2020-06-02 DIAGNOSIS — Z01818 Encounter for other preprocedural examination: Secondary | ICD-10-CM | POA: Insufficient documentation

## 2020-06-02 DIAGNOSIS — Z8541 Personal history of malignant neoplasm of cervix uteri: Secondary | ICD-10-CM | POA: Diagnosis not present

## 2020-06-02 DIAGNOSIS — M4807 Spinal stenosis, lumbosacral region: Secondary | ICD-10-CM | POA: Insufficient documentation

## 2020-06-02 DIAGNOSIS — E7800 Pure hypercholesterolemia, unspecified: Secondary | ICD-10-CM | POA: Insufficient documentation

## 2020-06-02 DIAGNOSIS — M5137 Other intervertebral disc degeneration, lumbosacral region: Secondary | ICD-10-CM | POA: Insufficient documentation

## 2020-06-02 HISTORY — DX: Gastro-esophageal reflux disease without esophagitis: K21.9

## 2020-06-02 HISTORY — DX: Personal history of urinary calculi: Z87.442

## 2020-06-02 HISTORY — DX: Dyspnea, unspecified: R06.00

## 2020-06-02 HISTORY — DX: Pneumonia, unspecified organism: J18.9

## 2020-06-02 HISTORY — DX: Unspecified osteoarthritis, unspecified site: M19.90

## 2020-06-02 HISTORY — DX: Headache, unspecified: R51.9

## 2020-06-02 LAB — CBC WITH DIFFERENTIAL/PLATELET
Abs Immature Granulocytes: 0.01 10*3/uL (ref 0.00–0.07)
Basophils Absolute: 0 10*3/uL (ref 0.0–0.1)
Basophils Relative: 0 %
Eosinophils Absolute: 0.2 10*3/uL (ref 0.0–0.5)
Eosinophils Relative: 3 %
HCT: 40.9 % (ref 36.0–46.0)
Hemoglobin: 12.9 g/dL (ref 12.0–15.0)
Immature Granulocytes: 0 %
Lymphocytes Relative: 43 %
Lymphs Abs: 3.1 10*3/uL (ref 0.7–4.0)
MCH: 27.9 pg (ref 26.0–34.0)
MCHC: 31.5 g/dL (ref 30.0–36.0)
MCV: 88.5 fL (ref 80.0–100.0)
Monocytes Absolute: 0.6 10*3/uL (ref 0.1–1.0)
Monocytes Relative: 8 %
Neutro Abs: 3.4 10*3/uL (ref 1.7–7.7)
Neutrophils Relative %: 46 %
Platelets: 274 10*3/uL (ref 150–400)
RBC: 4.62 MIL/uL (ref 3.87–5.11)
RDW: 14.1 % (ref 11.5–15.5)
WBC: 7.3 10*3/uL (ref 4.0–10.5)
nRBC: 0 % (ref 0.0–0.2)

## 2020-06-02 LAB — URINALYSIS, ROUTINE W REFLEX MICROSCOPIC
Bilirubin Urine: NEGATIVE
Glucose, UA: NEGATIVE mg/dL
Hgb urine dipstick: NEGATIVE
Ketones, ur: NEGATIVE mg/dL
Leukocytes,Ua: NEGATIVE
Nitrite: NEGATIVE
Protein, ur: NEGATIVE mg/dL
Specific Gravity, Urine: 1.019 (ref 1.005–1.030)
pH: 6 (ref 5.0–8.0)

## 2020-06-02 LAB — COMPREHENSIVE METABOLIC PANEL
ALT: 18 U/L (ref 0–44)
AST: 20 U/L (ref 15–41)
Albumin: 4.1 g/dL (ref 3.5–5.0)
Alkaline Phosphatase: 84 U/L (ref 38–126)
Anion gap: 9 (ref 5–15)
BUN: 18 mg/dL (ref 6–20)
CO2: 23 mmol/L (ref 22–32)
Calcium: 10.3 mg/dL (ref 8.9–10.3)
Chloride: 103 mmol/L (ref 98–111)
Creatinine, Ser: 1.1 mg/dL — ABNORMAL HIGH (ref 0.44–1.00)
GFR, Estimated: 58 mL/min — ABNORMAL LOW (ref >60)
Glucose, Bld: 109 mg/dL — ABNORMAL HIGH (ref 70–99)
Potassium: 3.4 mmol/L — ABNORMAL LOW (ref 3.5–5.1)
Sodium: 135 mmol/L (ref 135–145)
Total Bilirubin: 0.2 mg/dL — ABNORMAL LOW (ref 0.3–1.2)
Total Protein: 7.7 g/dL (ref 6.5–8.1)

## 2020-06-02 LAB — TYPE AND SCREEN
ABO/RH(D): B POS
Antibody Screen: NEGATIVE

## 2020-06-02 LAB — APTT: aPTT: 32 seconds (ref 24–36)

## 2020-06-02 LAB — PROTIME-INR
INR: 1 (ref 0.8–1.2)
Prothrombin Time: 12.7 seconds (ref 11.4–15.2)

## 2020-06-02 LAB — SURGICAL PCR SCREEN
MRSA, PCR: NEGATIVE
Staphylococcus aureus: POSITIVE — AB

## 2020-06-02 NOTE — Progress Notes (Signed)
PCP - Nolene Ebbs Cardiologist - denies Pain management: Dian Situ  PPM/ICD - denies   Chest x-ray - n/a EKG - 06/02/20 Stress Test - 06/06/02 (care everywhere) ECHO - denies Cardiac Cath - denies  Sleep Study - normal per pt. Over 10 years ago   No diabetes  Patient instructed to hold all Aspirin, NSAID's, herbal medications, fish oil and vitamins 7 days prior to surgery.   ERAS Protcol -yes PRE-SURGERY Ensure or G2- ensure given  COVID TEST- 06/07/20   Anesthesia review: yes, history of stress test at [facility]  Patient denies shortness of breath, fever, cough and chest pain at PAT appointment   All instructions explained to the patient, with a verbal understanding of the material. Patient agrees to go over the instructions while at home for a better understanding. Patient also instructed to self quarantine after being tested for COVID-19. The opportunity to ask questions was provided.

## 2020-06-02 NOTE — Progress Notes (Signed)
Surgical Instructions    Your procedure is scheduled on Wednesday May 18  Report to [facility] Main Entrance "A" at 5:30 A.M., then check in with the Admitting office.  Call this number if you have problems the morning of surgery:  [phone]   If you have any questions prior to your surgery date call [phone]: Open Monday-Friday 8am-4pm    Remember:  Do not eat after midnight the night before your surgery  You may drink clear liquids until 5:30 AM the morning of your surgery.   Clear liquids allowed are: Water, Non-Citrus Juices (without pulp), Carbonated Beverages, Clear Tea, Black Coffee Only, and Gatorade Patient Instructions  . The night before surgery:  o No food after midnight. ONLY clear liquids after midnight  . The day of surgery (if you do NOT have diabetes):  o Drink ONE (1) Pre-Surgery Clear Ensure by 5:30 AM the morning of surgery. Drink in one sitting. Do not sip.  o This drink was given to you during your hospital  pre-op appointment visit.  o Nothing else to drink after completing the  Pre-Surgery Clear Ensure.         If you have questions, please contact your surgeon's office.     Take these medicines the morning of surgery with A SIP OF WATER   baclofen (LIORESAL) promethazine (PHENERGAN) 25 HYDROcodone-acetaminophen HYSINGLA ER rosuvastatin (CRESTOR) sucralfate (CARAFATE)   Take if Needed the following: acetaminophen (TYLENOL) tiZANidine (ZANAFLEX) VENTOLIN HFA inhaler.  Please bring your inhalers with you the day of Surgery.   As of today, STOP taking any Aspirin (unless otherwise instructed by your surgeon) Aleve, Naproxen, Ibuprofen, Motrin, Advil, Goody's, BC's, all herbal medications, fish oil, and all vitamins.                     Do not wear jewelry, make up, or nail polish            Do not wear lotions, powders, perfumes, or deodorant.            Do not shave 48 hours prior to surgery.              Do not bring valuables to the  hospital.            [facility] is not responsible for any belongings or valuables.  Do NOT Smoke (Tobacco/Vaping) or drink Alcohol 24 hours prior to your procedure If you use a CPAP at night, you may bring all equipment for your overnight stay.   Contacts, glasses, dentures or bridgework may not be worn into surgery, please bring cases for these belongings   For patients admitted to the hospital, discharge time will be determined by your treatment team.   Patients discharged the day of surgery will not be allowed to drive home, and someone needs to stay with them for 24 hours.    Special instructions:   [place]- Preparing For Surgery  Before surgery, you can play an important role. Because skin is not sterile, your skin needs to be as free of germs as possible. You can reduce the number of germs on your skin by washing with CHG (chlorahexidine gluconate) Soap before surgery.  CHG is an antiseptic cleaner which kills germs and bonds with the skin to continue killing germs even after washing.    Oral Hygiene is also important to reduce your risk of infection.  Remember - BRUSH YOUR TEETH THE MORNING OF SURGERY WITH YOUR REGULAR TOOTHPASTE  Please do not use if you have an allergy to CHG or antibacterial soaps. If your skin becomes reddened/irritated stop using the CHG.  Do not shave (including legs and underarms) for at least 48 hours prior to first CHG shower. It is OK to shave your face.  Please follow these instructions carefully.   1. Shower the NIGHT BEFORE SURGERY and the MORNING OF SURGERY  2. If you chose to wash your hair, wash your hair first as usual with your normal shampoo.  3. After you shampoo, rinse your hair and body thoroughly to remove the shampoo.  4. Wash Face and genitals (private parts) with your normal soap.   5.  Shower the NIGHT BEFORE SURGERY and the MORNING OF SURGERY with CHG Soap.   6. Use CHG Soap as you would any other liquid soap. You can apply  CHG directly to the skin and wash gently with a scrungie or a clean washcloth.   7. Apply the CHG Soap to your body ONLY FROM THE NECK DOWN.  Do not use on open wounds or open sores. Avoid contact with your eyes, ears, mouth and genitals (private parts). Wash Face and genitals (private parts)  with your normal soap.   8. Wash thoroughly, paying special attention to the area where your surgery will be performed.  9. Thoroughly rinse your body with warm water from the neck down.  10. DO NOT shower/wash with your normal soap after using and rinsing off the CHG Soap.  11. Pat yourself dry with a CLEAN TOWEL.  12. Wear CLEAN PAJAMAS to bed the night before surgery  13. Place CLEAN SHEETS on your bed the night before your surgery  14. DO NOT SLEEP WITH PETS.   Day of Surgery: Take a shower with CHG soap. Wear Clean/Comfortable clothing the morning of surgery Do not apply any deodorants/lotions.   Remember to brush your teeth WITH YOUR REGULAR TOOTHPASTE.   Please read over the following fact sheets that you were given.

## 2020-06-02 NOTE — Progress Notes (Signed)
Notified office of abnormal MRSA swab result.

## 2020-06-03 NOTE — Progress Notes (Addendum)
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 She is also scheduled for L4-5, L5-S1 posterior spinal fusion on 06/10/20.   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   Preoperative EKG on 06/02/20 showed NSR, non-specific intra-ventricular conduction delay (new since 2018), minimal voltage criteria for LVH, anterior infarct (age undetermined).  IVCD looks likes LBBB-type morphology, but QRS 116 ms. No chest pain or SOB. No known cardiac testing since 2004. She is a non-smoker with no known history of DM, but given new EKG changes with plans for back to back lumbar fusions, would recommend preoperative cardiology evaluation per my discussion with anesthesiologist Albertha Ghee, MD. I have notified Butch Penny at Dr. Lynann Bologna' office. She was able to get patient an appointment at [facility] on 06/07/20 at 1:00 PM.  Preoperative COVID-19 testing and preoperative cardiology evaluation are scheduled for 06/07/20. Chart will be left for follow-up.   ADDENDUM  06/08/20 4:21 PM: 06/07/20 COVID-19 test negative. Preoperative cardiology evaluation with Dr. Einar Gip on 06/07/20. 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 She also reported some worsening DOE. He wrote, "In view of left bundle branch block, cardiomyopathy and heart failure need to be excluded.  She is in extreme pain from the back, I would not like to hold her surgery but would like to further risk stratify her.  I will obtain a stat echocardiogram tomorrow and unless the LVEF is normal, she will have to postpone surgery.  If LVEF is normal, she can undergo surgery with low risk and I will still perform outpatient nuclear stress test at some point to evaluate for ischemic etiology for abnormal EKG and her dyspnea on exertion.  Otherwise from cardiac standpoint, in view of underlying sinus tachycardia and suspicion for heart failure and cardiomyopathy, I will reduce the dose of lisinopril HCT from 20/25 mg in the morning to 10/12.5 mg in the morning and added metoprolol succinate 50 mg in the evening.  Unless echo claims abnormal, I will see him back in 6 weeks for follow-up and we will clear him for surgery if echocardiogram is normal." Echo done around 11:30 AM today, report is still pending. Butch Penny at Dr. Laurena Bering office to follow-up. Updated anesthesiologist Albertha Ghee, MD since TTE results still pending and anesthesiologist will need to review results/ additional cardiology input if case remains scheduled as of tomorrow morning.    VS: BP 114/82   Pulse (!) 103   Temp 37.1 C (Oral)   Resp 18   Ht 5\' 3"  (1.6 m)   Wt 62.7 kg  SpO2 100%   BMI 24.48 kg/m     PROVIDERS: Nolene Ebbs, MD is PCP Leana Gamer. Shanon Brow, MD is Pain Management   LABS: Labs reviewed: Acceptable for surgery. (all labs ordered are listed, but only abnormal results are displayed)  Labs Reviewed  SURGICAL PCR SCREEN - Abnormal; Notable for the following components:       Result Value   Staphylococcus aureus POSITIVE (*)    All other components within normal limits  COMPREHENSIVE METABOLIC PANEL - Abnormal; Notable for the following components:   Potassium 3.4 (*)    Glucose, Bld 109 (*)    Creatinine, Ser 1.10 (*)    Total Bilirubin 0.2 (*)    GFR, Estimated 58 (*)    All other components within normal limits  CBC WITH DIFFERENTIAL/PLATELET  PROTIME-INR  APTT  URINALYSIS, ROUTINE W REFLEX MICROSCOPIC  TYPE AND SCREEN     IMAGES: CT L-spine 02/02/20: IMPRESSION: 1. Stable large left-sided disc osteophyte complex at L4-5 contacting and displacing the left L4 nerve root and mild stable left lateral recess stenosis. 2. Unilateral pars defect on the left side at L4. 3. Advanced degenerative changes at L5-S1 with marked disc space narrowing, vacuum disc phenomenon and discogenic sclerosis. Stable mild/moderate right foraminal narrowing. 4. Mild right lateral recess encroachment at L2-3. 5. Bilateral renal calculi.  MRI L-spine 10/25/19: IMPRESSION: 1. Severe left L4-L5 neural foraminal stenosis due to combination of left asymmetric disc bulge and facet arthrosis. 2. Right asymmetric L5-S1 disc bulge with worsened right foraminal stenosis, now moderate. Mild left foraminal stenosis is unchanged. 3. No central spinal canal stenosis.   EKG: EKG 06/07/20:  Sinus tachycardia at rate of 104 bpm.  Left bundle branch block.  No further analysis.  Compared to 06/02/2020, incomplete left bundle is now complete left bundle branch block with associated secondary ST-T abnormality. [QRS 124 ms]  EKG 06/02/20: Normal sinus rhythm Non-specific intra-ventricular conduction delay New since previous tracing Minimal voltage criteria for LVH, may be normal variant ( Cornell product ) Anterior infarct , age undetermined Abnormal ECG Confirmed by Virl Axe [id number]) on 06/02/2020 9:39:56 PM - New IVCB, incomplete LBBB pattern, when compared to 03/10/16 tracing. See  DISCUSSION.   CV:  Echo 06/08/20 [facility] CV): Pending.  Normal stress echo 06/06/2002 at [facility].  Past Medical History:  Diagnosis Date  . Arthritis    back  . Back pain   . Cancer ([place])    cervical in remission  . Carpal tunnel syndrome   . Chronic pain   . Dyspnea   . GERD (gastroesophageal reflux disease)   . Headache   . History of kidney stones   . Hypercholesteremia   . Hypertension   . Leg pain    Bilateral  . Pneumonia   . Thrombosis of left renal vein ([place]) 09/08/2015    Past Surgical History:  Procedure Laterality Date  . ABDOMINAL HYSTERECTOMY    . ABDOMINAL SURGERY    . APPENDECTOMY    . BACK SURGERY    . bowel recontruction     1988  . CHOLECYSTECTOMY      MEDICATIONS: . acetaminophen (TYLENOL) 500 MG tablet  . amitriptyline (ELAVIL) 25 MG tablet  . baclofen (LIORESAL) 10 MG tablet  . HYDROcodone-acetaminophen (NORCO) 7.5-325 MG tablet  . HYSINGLA ER 60 MG T24A  . lisinopril-hydrochlorothiazide (PRINZIDE,ZESTORETIC) 20-25 MG tablet  . lubiprostone (AMITIZA) 24 MCG capsule  . pregabalin (LYRICA) 75 MG capsule  . promethazine (PHENERGAN) 25 MG tablet  . rosuvastatin (  CRESTOR) 10 MG tablet  . sucralfate (CARAFATE) 1 g tablet  . tiZANidine (ZANAFLEX) 2 MG tablet  . VENTOLIN HFA 108 (90 Base) MCG/ACT inhaler   No current facility-administered medications for this encounter.    Myra Gianotti, PA-C Surgical Short Stay/Anesthesiology [facility] Phone [phone] [facility] Phone [phone] 06/03/2020 4:19 PM

## 2020-06-07 ENCOUNTER — Encounter: Payer: Self-pay | Admitting: Cardiology

## 2020-06-07 ENCOUNTER — Other Ambulatory Visit (HOSPITAL_COMMUNITY)
Admission: RE | Admit: 2020-06-07 | Discharge: 2020-06-07 | Disposition: A | Discharge status: Home / Self Care | Payer: Medicare Other | Source: Ambulatory Visit | Attending: Orthopedic Surgery | Admitting: Orthopedic Surgery

## 2020-06-07 ENCOUNTER — Ambulatory Visit: Payer: Medicare Other | Admitting: Cardiology

## 2020-06-07 ENCOUNTER — Other Ambulatory Visit: Payer: Self-pay

## 2020-06-07 VITALS — BP 110/74 | HR 105 | Temp 98.0°F | Resp 17 | Ht 63.0 in | Wt 136.0 lb

## 2020-06-07 DIAGNOSIS — R9431 Abnormal electrocardiogram [ECG] [EKG]: Secondary | ICD-10-CM

## 2020-06-07 DIAGNOSIS — E7800 Pure hypercholesterolemia, unspecified: Secondary | ICD-10-CM

## 2020-06-07 DIAGNOSIS — R0600 Dyspnea, unspecified: Secondary | ICD-10-CM

## 2020-06-07 DIAGNOSIS — Z01810 Encounter for preprocedural cardiovascular examination: Secondary | ICD-10-CM

## 2020-06-07 DIAGNOSIS — R000 Tachycardia, unspecified: Secondary | ICD-10-CM

## 2020-06-07 DIAGNOSIS — I447 Left bundle-branch block, unspecified: Secondary | ICD-10-CM

## 2020-06-07 DIAGNOSIS — Z20822 Contact with and (suspected) exposure to covid-19: Secondary | ICD-10-CM | POA: Insufficient documentation

## 2020-06-07 DIAGNOSIS — Z01812 Encounter for preprocedural laboratory examination: Secondary | ICD-10-CM | POA: Insufficient documentation

## 2020-06-07 DIAGNOSIS — I10 Essential (primary) hypertension: Secondary | ICD-10-CM

## 2020-06-07 DIAGNOSIS — R0609 Other forms of dyspnea: Secondary | ICD-10-CM

## 2020-06-07 MED ORDER — LISINOPRIL 10 MG PO TABS: 10.0000 mg | ORAL_TABLET | Freq: Every day | ORAL | 2 refills | Status: DC

## 2020-06-07 MED ORDER — METOPROLOL SUCCINATE ER 50 MG PO TB24: 50.0000 mg | ORAL_TABLET | Freq: Every day | ORAL | 2 refills | Status: DC

## 2020-06-07 NOTE — Progress Notes (Signed)
Primary Physician/Referring:  Nolene Ebbs, MD  Patient ID: Katrina Vasquez, female    DOB: 1960/03/09, 60 y.o.   MRN: [id number]  Chief Complaint  Patient presents with  . surgical clearance  . Abnormal ECG    Ref by Phylliss Bob, MD  . New Patient (Initial Visit)   HPI:    Katrina Vasquez  is a 60 y.o. African-American female referred to me for preoperative cardiovascular risk ratification in view of abnormal ECG.  Patient has extensive multiple abdominal surgeries in the past, now scheduled for extensive back surgery via left paramedian incision and exposure of retroperitoneal structures and mobilization of retroperitoneal structures.  Patient has been evaluated by Dr. Lynann Bologna from orthopedic standpoint and Dr. Sherren Mocha early from vascular standpoint.  Her past medical history is significant for hypertension, hyperlipidemia.  For the past few months she has noticed gradual progression of dyspnea on exertion and endorses occasional episodes of PND, waking up in the middle of the night with shortness of breath.  Recently due to these episodes, she was prescribed bronchodilator therapy.  Denies chest pain or palpitations.  Past Medical History:  Diagnosis Date  . Arthritis    back  . Back pain   . Cancer ([place])    cervical in remission  . Carpal tunnel syndrome   . Chronic pain   . Dyspnea   . GERD (gastroesophageal reflux disease)   . Headache   . History of kidney stones   . Hypercholesteremia   . Hypertension   . Leg pain    Bilateral  . Pneumonia   . Thrombosis of left renal vein ([place]) 09/08/2015   Past Surgical History:  Procedure Laterality Date  . ABDOMINAL HYSTERECTOMY    . ABDOMINAL SURGERY    . APPENDECTOMY    . BACK SURGERY    . bowel recontruction     1988  . CHOLECYSTECTOMY     Family History  Problem Relation Age of Onset  . Kidney disease Mother   . Arthritis Mother   . Heart disease Father     Social History   Tobacco Use  . Smoking status:  Former Smoker    Years: 2.00    Types: Cigarettes, Cigars    Quit date: 2000    Years since quitting: 22.3  . Smokeless tobacco: Never Used  Substance Use Topics  . Alcohol use: No   Marital Status: Widowed  ROS  Review of Systems  Cardiovascular: Positive for dyspnea on exertion. Negative for chest pain and leg swelling.  Musculoskeletal: Positive for back pain and muscle cramps.  Gastrointestinal: Negative for melena.   Objective  Blood pressure 110/74, pulse (!) 105, temperature 98 F (36.7 C), resp. rate 17, height 5\' 3"  (1.6 m), weight 136 lb (61.7 kg), SpO2 98 %. Body mass index is 24.09 kg/m.  Vitals with BMI 06/07/2020 06/02/2020 05/31/2020  Height 5\' 3"  5\' 3"  5\' 3"   Weight 136 lbs 138 lbs 3 oz 139 lbs 10 oz  BMI 24.1 [id number]  Systolic [id number]  Diastolic 74 82 85  Pulse 123456 103 97     Physical Exam  Constitutional: No distress.  Eyes: Conjunctivae are normal.  Neck: No JVD present.  Cardiovascular: Regular rhythm, normal heart sounds, intact distal pulses and normal pulses. Exam reveals no gallop.  No murmur heard. Pulmonary/Chest: Effort normal and breath sounds normal. She exhibits no tenderness.  Abdominal: Soft. Bowel sounds are normal.  Musculoskeletal:  General: No edema. Normal range of motion.     Cervical back: Neck supple.  Neurological: She is alert and oriented to person, place, and time.  Skin: Skin is warm.     Laboratory examination:   Recent Labs    06/02/20 1200  NA 135  K 3.4*  CL 103  CO2 23  GLUCOSE 109*  BUN 18  CREATININE 1.10*  CALCIUM 10.3  GFRNONAA 58*   estimated creatinine clearance is 46.1 mL/min (A) (by C-G formula based on SCr of 1.1 mg/dL (H)).  CMP Latest Ref Rng & Units 06/02/2020 10/23/2017 06/21/2016  Glucose 70 - 99 mg/dL 109(H) 126(H) 113(H)  BUN 6 - 20 mg/dL 18 21(H) 11  Creatinine 0.44 - 1.00 mg/dL 1.10(H) 1.10(H) 0.83  Sodium 135 - 145 mmol/L 135 140 140  Potassium 3.5 - 5.1 mmol/L 3.4(L) 3.5 3.3(L)   Chloride 98 - 111 mmol/L 103 97(L) 105  CO2 22 - 32 mmol/L 23 29 26   Calcium 8.9 - 10.3 mg/dL 10.3 10.8(H) 9.8  Total Protein 6.5 - 8.1 g/dL 7.7 7.8 6.7  Total Bilirubin 0.3 - 1.2 mg/dL 0.2(L) 0.8 0.5  Alkaline Phos 38 - 126 U/L 84 81 58  AST 15 - 41 U/L 20 18 21   ALT 0 - 44 U/L 18 20 20    CBC Latest Ref Rng & Units 06/02/2020 10/23/2017 06/21/2016  WBC 4.0 - 10.5 K/uL 7.3 9.4 9.9  Hemoglobin 12.0 - 15.0 g/dL 12.9 13.2 12.0  Hematocrit 36.0 - 46.0 % 40.9 42.5 38.2  Platelets 150 - 400 K/uL 274 275 252    Lipid Panel No results for input(s): CHOL, TRIG, LDLCALC, VLDL, HDL, CHOLHDL, LDLDIRECT in the last 8760 hours. Lipid Panel  No results found for: CHOL, TRIG, HDL, CHOLHDL, VLDL, LDLCALC, LDLDIRECT, LABVLDL   HEMOGLOBIN A1C No results found for: HGBA1C, MPG TSH No results for input(s): TSH in the last 8760 hours.  External labs:     Medications and allergies   Allergies  Allergen Reactions  . Percocet [Oxycodone-Acetaminophen] Nausea And Vomiting  . Hydroxyzine Hcl Itching  . Tramadol Nausea And Vomiting     Current Outpatient Medications on File Prior to Visit  Medication Sig Dispense Refill  . acetaminophen (TYLENOL) 500 MG tablet Take 500-1,000 mg by mouth every 6 (six) hours as needed for moderate pain or headache.    Marland Kitchen amitriptyline (ELAVIL) 25 MG tablet Take 75 mg by mouth at bedtime.    . baclofen (LIORESAL) 10 MG tablet Take 10 mg by mouth 3 (three) times daily.    Marland Kitchen HYDROcodone-acetaminophen (NORCO) 7.5-325 MG tablet Take 1 tablet by mouth 4 (four) times daily.    Marland Kitchen HYSINGLA ER 60 MG T24A Take 60 mg by mouth daily.    Marland Kitchen lubiprostone (AMITIZA) 24 MCG capsule Take 24 mcg by mouth 2 (two) times daily.    . promethazine (PHENERGAN) 25 MG tablet Take 25 mg by mouth in the morning, at noon, and at bedtime.    . rosuvastatin (CRESTOR) 10 MG tablet Take 10 mg by mouth daily.    . sucralfate (CARAFATE) 1 g tablet Take 1 g by mouth 2 (two) times daily.    . VENTOLIN  HFA 108 (90 Base) MCG/ACT inhaler Inhale 2 puffs into the lungs 4 (four) times daily as needed for shortness of breath.   2   No current facility-administered medications on file prior to visit.     Radiology:   No results found.  Cardiac Studies:   None  EKG:     EKG 06/07/2020: Sinus tachycardia at rate of 104 bpm.  Left bundle branch block.  No further analysis.  Compared to 06/02/2020, incomplete left bundle is now complete left bundle branch block with associated secondary ST-T abnormality.  EKG 06/02/2020: Normal sinus rhythm at rate of 85 bpm, leftward axis, IVCD, incomplete left bundle branch block.  Borderline criteria for LVH.  Poor R wave progression, cannot exclude anteroseptal infarct old.  Nonspecific T abnormality.  Incomplete LBBB is new compared to 03/10/2016.  Assessment     ICD-10-CM   1. Preoperative cardiovascular examination  Z01.810 EKG 12-Lead  2. Dyspnea on exertion  R06.00 PCV ECHOCARDIOGRAM COMPLETE    PCV MYOCARDIAL PERFUSION WITH LEXISCAN  3. Hypercholesterolemia  E78.00   4. Abnormal EKG  R94.31   5. LBBB (left bundle branch block)  I44.7 PCV ECHOCARDIOGRAM COMPLETE    PCV MYOCARDIAL PERFUSION WITH LEXISCAN  6. Primary hypertension  I10 lisinopril (ZESTRIL) 10 MG tablet    metoprolol succinate (TOPROL-XL) 50 MG 24 hr tablet  7. Sinus tachycardia  R00.0 metoprolol succinate (TOPROL-XL) 50 MG 24 hr tablet     Medications Discontinued During This Encounter  Medication Reason  . tiZANidine (ZANAFLEX) 2 MG tablet Error  . pregabalin (LYRICA) 75 MG capsule Error  . lisinopril-hydrochlorothiazide (PRINZIDE,ZESTORETIC) 20-25 MG tablet Change in therapy    Meds ordered this encounter  Medications  . lisinopril (ZESTRIL) 10 MG tablet    Sig: Take 1 tablet (10 mg total) by mouth daily.    Dispense:  30 tablet    Refill:  2  . metoprolol succinate (TOPROL-XL) 50 MG 24 hr tablet    Sig: Take 1 tablet (50 mg total) by mouth daily after supper. Take with or  immediately following a meal.    Dispense:  30 tablet    Refill:  2   Orders Placed This Encounter  Procedures  . PCV MYOCARDIAL PERFUSION WITH LEXISCAN    Standing Status:   Future    Standing Expiration Date:   08/07/2020  . EKG 12-Lead  . PCV ECHOCARDIOGRAM COMPLETE    Standing Status:   Future    Standing Expiration Date:   06/07/2021   Recommendations:   Katrina Vasquez is a 60 y.o. African-American female referred to me for preoperative cardiovascular risk ratification in view of abnormal ECG.  Patient has extensive multiple abdominal surgeries in the past, now scheduled for extensive back surgery via left paramedian incision and exposure of retroperitoneal structures and mobilization of retroperitoneal structures.  Patient has been evaluated by Dr. Lynann Bologna from orthopedic standpoint and Dr. Sherren Mocha early from vascular standpoint.  Her past medical history is significant for hyperlipidemia, hypertension.  Over the past few months she has noticed worsening dyspnea on exertion and also has had episodes of PND by history and was prescribed bronchodilator therapy recently.  In view of left bundle branch block, cardiomyopathy and heart failure need to be excluded.  She is in extreme pain from the back, I would not like to hold her surgery but would like to further risk stratify her.  I will obtain a stat echocardiogram tomorrow and unless the LVEF is normal, she will have to postpone surgery.  If LVEF is normal, she can undergo surgery with low risk and I will still perform outpatient nuclear stress test at some point to evaluate for ischemic etiology for abnormal EKG and her dyspnea on exertion.  Otherwise from cardiac standpoint, in view of underlying sinus tachycardia and suspicion  for heart failure and cardiomyopathy, I will reduce the dose of lisinopril HCT from 20/25 mg in the morning to 10/12.5 mg in the morning and added metoprolol succinate 50 mg in the evening.  Unless echo claims  abnormal, I will see him back in 6 weeks for follow-up and we will clear him for surgery if echocardiogram is normal.    Adrian Prows, MD, [facility] 06/07/2020, 10:05 PM Office: [phone]  CC: Phylliss Bob, MD

## 2020-06-08 ENCOUNTER — Encounter: Payer: Self-pay | Admitting: Cardiology

## 2020-06-08 ENCOUNTER — Telehealth: Payer: Self-pay

## 2020-06-08 ENCOUNTER — Ambulatory Visit: Payer: Medicare Other

## 2020-06-08 DIAGNOSIS — R0600 Dyspnea, unspecified: Secondary | ICD-10-CM

## 2020-06-08 DIAGNOSIS — R0609 Other forms of dyspnea: Secondary | ICD-10-CM

## 2020-06-08 DIAGNOSIS — I447 Left bundle-branch block, unspecified: Secondary | ICD-10-CM

## 2020-06-08 LAB — SARS CORONAVIRUS 2 (TAT 6-24 HRS): SARS Coronavirus 2: NEGATIVE

## 2020-06-08 NOTE — Anesthesia Preprocedure Evaluation (Addendum)
Anesthesia Evaluation  Patient identified by MRN, date of birth, ID band Patient awake    Reviewed: Allergy & Precautions, NPO status , Patient's Chart, lab work & pertinent test results  History of Anesthesia Complications Negative for: history of anesthetic complications  Airway Mallampati: I  TM Distance: >3 FB Neck ROM: Full    Dental  (+) Edentulous Upper, Edentulous Lower   Pulmonary shortness of breath, neg sleep apnea, neg COPD, neg recent URI, former smoker,  Covid-19 Nucleic Acid Test Results Lab Results      Component                Value               Date                      SARSCOV2NAA              NEGATIVE            06/07/2020              breath sounds clear to auscultation       Cardiovascular hypertension, Pt. on medications  Rhythm:Regular     Neuro/Psych  Headaches, neg Seizures  Neuromuscular disease negative psych ROS   GI/Hepatic   Endo/Other  negative endocrine ROS  Renal/GU      Musculoskeletal  (+) Arthritis ,   Abdominal   Peds  Hematology negative hematology ROS (+) Lab Results      Component                Value               Date                      WBC                      7.3                 06/02/2020                HGB                      12.9                06/02/2020                HCT                      40.9                06/02/2020                MCV                      88.5                06/02/2020                PLT                      274                 06/02/2020              Anesthesia Other Findings  Patient is a 60 year old female scheduled for the above procedure. She is  also scheduled for L4-5, L5-S1 posterior spinal fusion on 06/10/20.   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   Preoperative EKG on 06/02/20 showed NSR, non-specific intra-ventricular conduction delay (new since 2018), minimal voltage criteria for LVH, anterior infarct (age undetermined).  IVCD looks likes LBBB-type morphology, but QRS 116 ms. No chest pain or SOB. No known cardiac testing since 2004. She is a non-smoker with no known history of DM, but given new EKG changes with plans for back to back lumbar fusions, would recommend preoperative cardiology evaluation per my discussion with anesthesiologist Albertha Ghee, MD. I have notified Butch Penny at Dr. Lynann Bologna' office. She was able to get patient an appointment at [facility] on 06/07/20 at 1:00 PM.  Preoperative COVID-19 testing and preoperative cardiology evaluation are scheduled for 06/07/20. Chart will be left for follow-up.   ADDENDUM 06/08/20 4:21 PM: 06/07/20 COVID-19 test negative. Preoperative cardiology evaluation with Dr. Einar Gip on 06/07/20. 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 She also reported some worsening DOE. He wrote, "In view of left bundle branch block, cardiomyopathy and heart failure need to be excluded. She is in extreme pain from the back, I would not like to hold her surgery but would like to further risk stratify her.  I will obtain a stat echocardiogram tomorrow and unless the LVEF is normal, she will have to postpone surgery. If LVEF is normal, she can undergo surgery with low risk and I will still perform outpatient nuclear stress test at some point to evaluate for ischemic etiology for abnormal EKG and her dyspnea on exertion. Otherwise from cardiac standpoint, in view of underlying sinus tachycardia and suspicion  for heart failure and cardiomyopathy, I will reduce the dose of lisinopril HCT from 20/25 mg in the morning to 10/12.5 mg in the morning and added metoprolol succinate 50 mg in the evening. Unless echo claims abnormal, I will see him back in 6 weeks for follow-up and we will clear him for surgery if echocardiogram is normal." Echo done around 11:30 AM today, report is still pending. Butch Penny at Dr. Laurena Bering office to follow-up. Updated anesthesiologist Albertha Ghee, MD since TTE results still pending and anesthesiologist will need to review results/ additional cardiology input if case remains scheduled as of tomorrow morning.   Echo normal Dr Everitt Amber to f/u in 6 weeks  Reproductive/Obstetrics                           Anesthesia Physical Anesthesia Plan  ASA: II  Anesthesia Plan: General   Post-op Pain Management:    Induction: Intravenous  PONV Risk Score and Plan: 3 and Ondansetron and Dexamethasone  Airway Management Planned: Oral ETT  Additional Equipment: None  Intra-op Plan:   Post-operative Plan:   Informed Consent: I have reviewed the patients History and Physical, chart, labs and discussed the procedure including the risks, benefits and alternatives for the proposed anesthesia with the patient or authorized representative who has indicated his/her understanding and acceptance.     Dental advisory given  Plan Discussed with: CRNA and Surgeon  Anesthesia Plan Comments: (See PAT note written by  Myra Gianotti, PA-C. Cardiology clearance by Dr. Einar Gip still pending as of 06/08/20 4:24 PM--clearance will depend on 06/08/20 echo results.  )      Anesthesia Quick Evaluation

## 2020-06-08 NOTE — Progress Notes (Signed)
Echocardiogram 06/08/2020:  Normal LV systolic function with EF 63%. Left ventricle cavity is normal in size. Normal left ventricular wall thickness. Normal global wall motion. Abnormal septal wall motion due to left bundle branch block. Doppler evidence of grade I (impaired) diastolic dysfunction, normal LAP. Calculated EF 63%. Structurally normal tricuspid valve with trace regurgitation. No evidence of tricuspid valve stenosis. No evidence of pulmonary hypertension.  Patient aware of the results

## 2020-06-08 NOTE — Telephone Encounter (Signed)
[facility] called and would like to know if pt is cleared for procedure and would like the echo results.  [phone]

## 2020-06-09 ENCOUNTER — Encounter (HOSPITAL_COMMUNITY)
Admission: RE | Disposition: A | Discharge status: Home / Self Care | Payer: Self-pay | Source: Home / Self Care | Attending: Orthopedic Surgery

## 2020-06-09 ENCOUNTER — Inpatient Hospital Stay (HOSPITAL_COMMUNITY)
Admission: RE | Admit: 2020-06-09 | Discharge: 2020-06-11 | DRG: 455 | Disposition: A | Discharge status: Home Health | Payer: Medicare Other | Attending: Orthopedic Surgery | Admitting: Orthopedic Surgery

## 2020-06-09 ENCOUNTER — Inpatient Hospital Stay (HOSPITAL_COMMUNITY): Payer: Medicare Other | Admitting: Certified Registered Nurse Anesthetist

## 2020-06-09 ENCOUNTER — Encounter (HOSPITAL_COMMUNITY): Payer: Self-pay | Admitting: Orthopedic Surgery

## 2020-06-09 ENCOUNTER — Inpatient Hospital Stay (HOSPITAL_COMMUNITY): Payer: Medicare Other

## 2020-06-09 ENCOUNTER — Other Ambulatory Visit: Payer: Self-pay

## 2020-06-09 ENCOUNTER — Inpatient Hospital Stay (HOSPITAL_COMMUNITY): Payer: Medicare Other | Admitting: Vascular Surgery

## 2020-06-09 DIAGNOSIS — Z8249 Family history of ischemic heart disease and other diseases of the circulatory system: Secondary | ICD-10-CM | POA: Diagnosis not present

## 2020-06-09 DIAGNOSIS — Z419 Encounter for procedure for purposes other than remedying health state, unspecified: Secondary | ICD-10-CM

## 2020-06-09 DIAGNOSIS — M5116 Intervertebral disc disorders with radiculopathy, lumbar region: Secondary | ICD-10-CM | POA: Diagnosis present

## 2020-06-09 DIAGNOSIS — I10 Essential (primary) hypertension: Secondary | ICD-10-CM | POA: Diagnosis present

## 2020-06-09 DIAGNOSIS — Z79899 Other long term (current) drug therapy: Secondary | ICD-10-CM

## 2020-06-09 DIAGNOSIS — M5117 Intervertebral disc disorders with radiculopathy, lumbosacral region: Secondary | ICD-10-CM | POA: Diagnosis present

## 2020-06-09 DIAGNOSIS — Z841 Family history of disorders of kidney and ureter: Secondary | ICD-10-CM | POA: Diagnosis not present

## 2020-06-09 DIAGNOSIS — Z87891 Personal history of nicotine dependence: Secondary | ICD-10-CM

## 2020-06-09 DIAGNOSIS — Z885 Allergy status to narcotic agent status: Secondary | ICD-10-CM | POA: Diagnosis not present

## 2020-06-09 DIAGNOSIS — Z20822 Contact with and (suspected) exposure to covid-19: Secondary | ICD-10-CM | POA: Diagnosis present

## 2020-06-09 DIAGNOSIS — M1990 Unspecified osteoarthritis, unspecified site: Secondary | ICD-10-CM | POA: Diagnosis present

## 2020-06-09 DIAGNOSIS — K219 Gastro-esophageal reflux disease without esophagitis: Secondary | ICD-10-CM | POA: Diagnosis present

## 2020-06-09 DIAGNOSIS — R0602 Shortness of breath: Secondary | ICD-10-CM | POA: Diagnosis present

## 2020-06-09 DIAGNOSIS — E7800 Pure hypercholesterolemia, unspecified: Secondary | ICD-10-CM | POA: Diagnosis present

## 2020-06-09 DIAGNOSIS — Z888 Allergy status to other drugs, medicaments and biological substances status: Secondary | ICD-10-CM | POA: Diagnosis not present

## 2020-06-09 DIAGNOSIS — G8929 Other chronic pain: Secondary | ICD-10-CM | POA: Diagnosis present

## 2020-06-09 DIAGNOSIS — R519 Headache, unspecified: Secondary | ICD-10-CM | POA: Diagnosis present

## 2020-06-09 DIAGNOSIS — M48061 Spinal stenosis, lumbar region without neurogenic claudication: Secondary | ICD-10-CM | POA: Diagnosis present

## 2020-06-09 DIAGNOSIS — M5410 Radiculopathy, site unspecified: Secondary | ICD-10-CM | POA: Diagnosis present

## 2020-06-09 DIAGNOSIS — M5416 Radiculopathy, lumbar region: Secondary | ICD-10-CM | POA: Diagnosis present

## 2020-06-09 DIAGNOSIS — M5136 Other intervertebral disc degeneration, lumbar region: Secondary | ICD-10-CM | POA: Diagnosis not present

## 2020-06-09 DIAGNOSIS — Z8261 Family history of arthritis: Secondary | ICD-10-CM

## 2020-06-09 DIAGNOSIS — M5137 Other intervertebral disc degeneration, lumbosacral region: Secondary | ICD-10-CM | POA: Diagnosis not present

## 2020-06-09 HISTORY — PX: ABDOMINAL EXPOSURE: SHX5708

## 2020-06-09 HISTORY — PX: ANTERIOR LUMBAR FUSION: SHX1170

## 2020-06-09 LAB — ABO/RH: ABO/RH(D): B POS

## 2020-06-09 SURGERY — ANTERIOR LUMBAR FUSION 2 LEVELS
Anesthesia: General | Site: Spine Lumbar

## 2020-06-09 MED ORDER — CEFAZOLIN SODIUM-DEXTROSE 2-4 GM/100ML-% IV SOLN: 2.0000 g | INTRAVENOUS | Status: DC

## 2020-06-09 MED ORDER — HYDROMORPHONE HCL 1 MG/ML IJ SOLN
INTRAMUSCULAR | Status: AC
  Filled 2020-06-09: qty 1

## 2020-06-09 MED ORDER — BISACODYL 5 MG PO TBEC: 5.0000 mg | DELAYED_RELEASE_TABLET | Freq: Every day | ORAL | Status: DC | PRN

## 2020-06-09 MED ORDER — ACETAMINOPHEN 325 MG PO TABS
650.0000 mg | ORAL_TABLET | ORAL | Status: DC | PRN
  Administered 2020-06-10: 650 mg via ORAL
  Filled 2020-06-09 (×2): qty 2

## 2020-06-09 MED ORDER — HYDROMORPHONE HCL 2 MG PO TABS
1.0000 mg | ORAL_TABLET | ORAL | Status: DC | PRN
  Administered 2020-06-09 – 2020-06-11 (×4): 2 mg via ORAL
  Filled 2020-06-09 (×4): qty 1

## 2020-06-09 MED ORDER — METHOCARBAMOL 500 MG PO TABS
ORAL_TABLET | ORAL | Status: AC
  Filled 2020-06-09: qty 1

## 2020-06-09 MED ORDER — ROCURONIUM BROMIDE 10 MG/ML (PF) SYRINGE
PREFILLED_SYRINGE | INTRAVENOUS | Status: AC
  Filled 2020-06-09: qty 10

## 2020-06-09 MED ORDER — AMITRIPTYLINE HCL 25 MG PO TABS
75.0000 mg | ORAL_TABLET | Freq: Every day | ORAL | Status: DC
  Administered 2020-06-09: 75 mg via ORAL
  Administered 2020-06-10: 25 mg via ORAL
  Filled 2020-06-09 (×2): qty 3
  Filled 2020-06-09 (×2): qty 1
  Filled 2020-06-09: qty 3

## 2020-06-09 MED ORDER — LUBIPROSTONE 24 MCG PO CAPS
24.0000 ug | ORAL_CAPSULE | Freq: Two times a day (BID) | ORAL | Status: DC
  Administered 2020-06-09 – 2020-06-11 (×4): 24 ug via ORAL
  Filled 2020-06-09 (×5): qty 1

## 2020-06-09 MED ORDER — SUGAMMADEX SODIUM 200 MG/2ML IV SOLN
INTRAVENOUS | Status: DC | PRN
  Administered 2020-06-09: 200 mg via INTRAVENOUS

## 2020-06-09 MED ORDER — MENTHOL 3 MG MT LOZG: 1.0000 | LOZENGE | OROMUCOSAL | Status: DC | PRN

## 2020-06-09 MED ORDER — CHLORHEXIDINE GLUCONATE 4 % EX LIQD: 60.0000 mL | Freq: Once | CUTANEOUS | Status: DC

## 2020-06-09 MED ORDER — FENTANYL CITRATE (PF) 250 MCG/5ML IJ SOLN
INTRAMUSCULAR | Status: AC
  Filled 2020-06-09: qty 5

## 2020-06-09 MED ORDER — ENSURE PRE-SURGERY PO LIQD
296.0000 mL | Freq: Once | ORAL | Status: AC
  Administered 2020-06-10: 296 mL via ORAL
  Filled 2020-06-09 (×2): qty 296

## 2020-06-09 MED ORDER — PHENYLEPHRINE HCL (PRESSORS) 10 MG/ML IV SOLN
INTRAVENOUS | Status: DC | PRN
  Administered 2020-06-09: 40 ug via INTRAVENOUS

## 2020-06-09 MED ORDER — PHENYLEPHRINE 40 MCG/ML (10ML) SYRINGE FOR IV PUSH (FOR BLOOD PRESSURE SUPPORT)
PREFILLED_SYRINGE | INTRAVENOUS | Status: AC
  Filled 2020-06-09: qty 10

## 2020-06-09 MED ORDER — MORPHINE SULFATE (PF) 2 MG/ML IV SOLN
1.0000 mg | INTRAVENOUS | Status: DC | PRN
  Administered 2020-06-09 (×2): 2 mg via INTRAVENOUS
  Filled 2020-06-09 (×2): qty 1

## 2020-06-09 MED ORDER — PHENOL 1.4 % MT LIQD: 1.0000 | OROMUCOSAL | Status: DC | PRN

## 2020-06-09 MED ORDER — FLEET ENEMA 7-19 GM/118ML RE ENEM: 1.0000 | ENEMA | Freq: Once | RECTAL | Status: DC | PRN

## 2020-06-09 MED ORDER — ALBUTEROL SULFATE HFA 108 (90 BASE) MCG/ACT IN AERS
2.0000 | INHALATION_SPRAY | Freq: Four times a day (QID) | RESPIRATORY_TRACT | Status: DC | PRN
  Filled 2020-06-09: qty 6.7

## 2020-06-09 MED ORDER — POVIDONE-IODINE 7.5 % EX SOLN
Freq: Once | CUTANEOUS | Status: DC
  Filled 2020-06-09: qty 118

## 2020-06-09 MED ORDER — PHENYLEPHRINE 40 MCG/ML (10ML) SYRINGE FOR IV PUSH (FOR BLOOD PRESSURE SUPPORT): PREFILLED_SYRINGE | INTRAVENOUS | Status: DC | PRN

## 2020-06-09 MED ORDER — SENNOSIDES-DOCUSATE SODIUM 8.6-50 MG PO TABS: 1.0000 | ORAL_TABLET | Freq: Every evening | ORAL | Status: DC | PRN

## 2020-06-09 MED ORDER — PHENYLEPHRINE HCL-NACL 10-0.9 MG/250ML-% IV SOLN
INTRAVENOUS | Status: DC | PRN
  Administered 2020-06-09: 40 ug/min via INTRAVENOUS

## 2020-06-09 MED ORDER — BACLOFEN 10 MG PO TABS
10.0000 mg | ORAL_TABLET | Freq: Three times a day (TID) | ORAL | Status: DC
  Administered 2020-06-09 – 2020-06-11 (×5): 10 mg via ORAL
  Filled 2020-06-09 (×6): qty 1

## 2020-06-09 MED ORDER — SODIUM CHLORIDE 0.9% FLUSH
3.0000 mL | Freq: Two times a day (BID) | INTRAVENOUS | Status: DC
  Administered 2020-06-09: 3 mL via INTRAVENOUS

## 2020-06-09 MED ORDER — ONDANSETRON HCL 4 MG/2ML IJ SOLN
INTRAMUSCULAR | Status: AC
  Filled 2020-06-09: qty 2

## 2020-06-09 MED ORDER — POTASSIUM CHLORIDE IN NACL 20-0.9 MEQ/L-% IV SOLN
INTRAVENOUS | Status: DC
  Filled 2020-06-09: qty 1000

## 2020-06-09 MED ORDER — CHLORHEXIDINE GLUCONATE 0.12 % MT SOLN: 15.0000 mL | Freq: Once | OROMUCOSAL | Status: AC

## 2020-06-09 MED ORDER — PROMETHAZINE HCL 25 MG PO TABS
25.0000 mg | ORAL_TABLET | Freq: Four times a day (QID) | ORAL | Status: DC | PRN
  Administered 2020-06-09: 25 mg via ORAL
  Filled 2020-06-09: qty 1

## 2020-06-09 MED ORDER — FENTANYL CITRATE (PF) 100 MCG/2ML IJ SOLN
INTRAMUSCULAR | Status: AC
  Filled 2020-06-09: qty 2

## 2020-06-09 MED ORDER — HYDROMORPHONE HCL 1 MG/ML IJ SOLN
0.2500 mg | INTRAMUSCULAR | Status: DC | PRN
  Administered 2020-06-09 (×2): 0.5 mg via INTRAVENOUS

## 2020-06-09 MED ORDER — HYDROMORPHONE HCL 1 MG/ML IJ SOLN
1.0000 mg | INTRAMUSCULAR | Status: DC | PRN
  Administered 2020-06-09 – 2020-06-11 (×6): 1 mg via INTRAVENOUS
  Filled 2020-06-09 (×6): qty 1

## 2020-06-09 MED ORDER — CEFAZOLIN SODIUM-DEXTROSE 2-4 GM/100ML-% IV SOLN
2.0000 g | INTRAVENOUS | Status: AC
  Administered 2020-06-09: 2 g via INTRAVENOUS
  Filled 2020-06-09: qty 100

## 2020-06-09 MED ORDER — BUPIVACAINE-EPINEPHRINE (PF) 0.25% -1:200000 IJ SOLN
INTRAMUSCULAR | Status: DC | PRN
  Administered 2020-06-09: 10 mL

## 2020-06-09 MED ORDER — FENTANYL CITRATE (PF) 100 MCG/2ML IJ SOLN
25.0000 ug | INTRAMUSCULAR | Status: DC | PRN
  Administered 2020-06-09 (×3): 50 ug via INTRAVENOUS

## 2020-06-09 MED ORDER — CHLORHEXIDINE GLUCONATE 0.12 % MT SOLN
OROMUCOSAL | Status: AC
  Administered 2020-06-09: 15 mL via OROMUCOSAL
  Filled 2020-06-09: qty 15

## 2020-06-09 MED ORDER — ROSUVASTATIN CALCIUM 5 MG PO TABS
10.0000 mg | ORAL_TABLET | Freq: Every day | ORAL | Status: DC
  Administered 2020-06-09 – 2020-06-11 (×3): 10 mg via ORAL
  Filled 2020-06-09 (×3): qty 2

## 2020-06-09 MED ORDER — LACTATED RINGERS IV SOLN: INTRAVENOUS | Status: DC | PRN

## 2020-06-09 MED ORDER — MIDAZOLAM HCL 2 MG/2ML IJ SOLN
INTRAMUSCULAR | Status: AC
  Filled 2020-06-09: qty 2

## 2020-06-09 MED ORDER — ACETAMINOPHEN 10 MG/ML IV SOLN: 1000.0000 mg | Freq: Once | INTRAVENOUS | Status: DC | PRN

## 2020-06-09 MED ORDER — METOPROLOL TARTRATE 5 MG/5ML IV SOLN
INTRAVENOUS | Status: DC | PRN
  Administered 2020-06-09 (×2): 2 mg via INTRAVENOUS
  Administered 2020-06-09: 1 mg via INTRAVENOUS

## 2020-06-09 MED ORDER — LIDOCAINE 2% (20 MG/ML) 5 ML SYRINGE
INTRAMUSCULAR | Status: DC | PRN
  Administered 2020-06-09: 60 mg via INTRAVENOUS

## 2020-06-09 MED ORDER — FENTANYL CITRATE (PF) 250 MCG/5ML IJ SOLN
INTRAMUSCULAR | Status: DC | PRN
  Administered 2020-06-09: 50 ug via INTRAVENOUS
  Administered 2020-06-09: 100 ug via INTRAVENOUS
  Administered 2020-06-09 (×2): 50 ug via INTRAVENOUS

## 2020-06-09 MED ORDER — ALUM & MAG HYDROXIDE-SIMETH 200-200-20 MG/5ML PO SUSP: 30.0000 mL | Freq: Four times a day (QID) | ORAL | Status: DC | PRN

## 2020-06-09 MED ORDER — ROCURONIUM BROMIDE 10 MG/ML (PF) SYRINGE
PREFILLED_SYRINGE | INTRAVENOUS | Status: DC | PRN
  Administered 2020-06-09 (×2): 20 mg via INTRAVENOUS
  Administered 2020-06-09: 40 mg via INTRAVENOUS
  Administered 2020-06-09: 20 mg via INTRAVENOUS

## 2020-06-09 MED ORDER — PHENYLEPHRINE HCL-NACL 10-0.9 MG/250ML-% IV SOLN
INTRAVENOUS | Status: AC
  Filled 2020-06-09: qty 250

## 2020-06-09 MED ORDER — PROMETHAZINE HCL 25 MG PO TABS
25.0000 mg | ORAL_TABLET | ORAL | Status: DC | PRN
  Administered 2020-06-10 – 2020-06-11 (×4): 25 mg via ORAL
  Filled 2020-06-09 (×4): qty 1

## 2020-06-09 MED ORDER — METHOCARBAMOL 500 MG PO TABS
500.0000 mg | ORAL_TABLET | Freq: Once | ORAL | Status: AC | PRN
  Administered 2020-06-09: 500 mg via ORAL

## 2020-06-09 MED ORDER — MIDAZOLAM HCL 2 MG/2ML IJ SOLN
INTRAMUSCULAR | Status: DC | PRN
  Administered 2020-06-09: 2 mg via INTRAVENOUS

## 2020-06-09 MED ORDER — LISINOPRIL 10 MG PO TABS
10.0000 mg | ORAL_TABLET | Freq: Every day | ORAL | Status: DC
  Administered 2020-06-09 – 2020-06-11 (×3): 10 mg via ORAL
  Filled 2020-06-09 (×3): qty 1

## 2020-06-09 MED ORDER — LACTATED RINGERS IV SOLN: INTRAVENOUS | Status: DC

## 2020-06-09 MED ORDER — SODIUM CHLORIDE 0.9 % IV SOLN: 250.0000 mL | INTRAVENOUS | Status: DC

## 2020-06-09 MED ORDER — 0.9 % SODIUM CHLORIDE (POUR BTL) OPTIME
TOPICAL | Status: DC | PRN
  Administered 2020-06-09: 1000 mL

## 2020-06-09 MED ORDER — PROPOFOL 10 MG/ML IV BOLUS
INTRAVENOUS | Status: DC | PRN
  Administered 2020-06-09: 100 mg via INTRAVENOUS

## 2020-06-09 MED ORDER — SODIUM CHLORIDE 0.9% FLUSH
10.0000 mL | Freq: Two times a day (BID) | INTRAVENOUS | Status: DC
  Administered 2020-06-09 – 2020-06-10 (×2): 10 mL

## 2020-06-09 MED ORDER — LIDOCAINE 2% (20 MG/ML) 5 ML SYRINGE
INTRAMUSCULAR | Status: AC
  Filled 2020-06-09: qty 5

## 2020-06-09 MED ORDER — ZOLPIDEM TARTRATE 5 MG PO TABS: 5.0000 mg | ORAL_TABLET | Freq: Every evening | ORAL | Status: DC | PRN

## 2020-06-09 MED ORDER — HYDROCODONE-ACETAMINOPHEN 7.5-325 MG PO TABS
1.0000 | ORAL_TABLET | Freq: Four times a day (QID) | ORAL | Status: DC
  Administered 2020-06-09 – 2020-06-11 (×5): 1 via ORAL
  Filled 2020-06-09 (×5): qty 1

## 2020-06-09 MED ORDER — DOCUSATE SODIUM 100 MG PO CAPS
100.0000 mg | ORAL_CAPSULE | Freq: Two times a day (BID) | ORAL | Status: DC
  Administered 2020-06-09 – 2020-06-10 (×3): 100 mg via ORAL
  Filled 2020-06-09 (×4): qty 1

## 2020-06-09 MED ORDER — METOPROLOL TARTRATE 5 MG/5ML IV SOLN
INTRAVENOUS | Status: AC
  Filled 2020-06-09: qty 5

## 2020-06-09 MED ORDER — PROPOFOL 10 MG/ML IV BOLUS
INTRAVENOUS | Status: AC
  Filled 2020-06-09: qty 20

## 2020-06-09 MED ORDER — ACETAMINOPHEN 10 MG/ML IV SOLN
INTRAVENOUS | Status: AC
  Filled 2020-06-09: qty 100

## 2020-06-09 MED ORDER — ONDANSETRON HCL 4 MG/2ML IJ SOLN
4.0000 mg | Freq: Four times a day (QID) | INTRAMUSCULAR | Status: DC | PRN
  Administered 2020-06-10: 4 mg via INTRAVENOUS
  Filled 2020-06-09 (×2): qty 2

## 2020-06-09 MED ORDER — HYDROMORPHONE HCL 2 MG PO TABS
2.0000 mg | ORAL_TABLET | Freq: Once | ORAL | Status: AC | PRN
  Administered 2020-06-09: 2 mg via ORAL

## 2020-06-09 MED ORDER — ONDANSETRON HCL 4 MG/2ML IJ SOLN
INTRAMUSCULAR | Status: DC | PRN
  Administered 2020-06-09: 4 mg via INTRAVENOUS

## 2020-06-09 MED ORDER — ACETAMINOPHEN 10 MG/ML IV SOLN
INTRAVENOUS | Status: DC | PRN
  Administered 2020-06-09: 1000 mg via INTRAVENOUS

## 2020-06-09 MED ORDER — ACETAMINOPHEN 650 MG RE SUPP: 650.0000 mg | RECTAL | Status: DC | PRN

## 2020-06-09 MED ORDER — METOPROLOL SUCCINATE ER 50 MG PO TB24
50.0000 mg | ORAL_TABLET | Freq: Every day | ORAL | Status: DC
  Administered 2020-06-09 – 2020-06-10 (×2): 50 mg via ORAL
  Filled 2020-06-09 (×2): qty 1

## 2020-06-09 MED ORDER — SODIUM CHLORIDE 0.9% FLUSH: 10.0000 mL | INTRAVENOUS | Status: DC | PRN

## 2020-06-09 MED ORDER — HYDROMORPHONE HCL 2 MG PO TABS
ORAL_TABLET | ORAL | Status: AC
  Filled 2020-06-09: qty 1

## 2020-06-09 MED ORDER — BUPIVACAINE-EPINEPHRINE (PF) 0.25% -1:200000 IJ SOLN
INTRAMUSCULAR | Status: AC
  Filled 2020-06-09: qty 30

## 2020-06-09 MED ORDER — ALBUMIN HUMAN 5 % IV SOLN: INTRAVENOUS | Status: DC | PRN

## 2020-06-09 MED ORDER — ONDANSETRON HCL 4 MG PO TABS: 4.0000 mg | ORAL_TABLET | Freq: Four times a day (QID) | ORAL | Status: DC | PRN

## 2020-06-09 MED ORDER — SUCRALFATE 1 G PO TABS
1.0000 g | ORAL_TABLET | Freq: Two times a day (BID) | ORAL | Status: DC
  Administered 2020-06-09 – 2020-06-11 (×4): 1 g via ORAL
  Filled 2020-06-09 (×5): qty 1

## 2020-06-09 MED ORDER — CEFAZOLIN SODIUM-DEXTROSE 2-4 GM/100ML-% IV SOLN
2.0000 g | Freq: Three times a day (TID) | INTRAVENOUS | Status: AC
  Administered 2020-06-09 – 2020-06-10 (×2): 2 g via INTRAVENOUS
  Filled 2020-06-09 (×2): qty 100

## 2020-06-09 MED ORDER — CHLORHEXIDINE GLUCONATE CLOTH 2 % EX PADS
6.0000 | MEDICATED_PAD | Freq: Every day | CUTANEOUS | Status: DC
  Administered 2020-06-09 – 2020-06-11 (×2): 6 via TOPICAL

## 2020-06-09 MED ORDER — ORAL CARE MOUTH RINSE: 15.0000 mL | Freq: Once | OROMUCOSAL | Status: AC

## 2020-06-09 MED ORDER — THROMBIN (RECOMBINANT) 20000 UNITS EX SOLR
CUTANEOUS | Status: AC
  Filled 2020-06-09: qty 20000

## 2020-06-09 MED ORDER — SODIUM CHLORIDE 0.9% FLUSH: 3.0000 mL | INTRAVENOUS | Status: DC | PRN

## 2020-06-09 SURGICAL SUPPLY — 94 items
ADH SKN CLS APL DERMABOND .7 (GAUZE/BANDAGES/DRESSINGS)
AGENT HMST KT MTR STRL THRMB (HEMOSTASIS)
APL SKNCLS STERI-STRIP NONHPOA (GAUZE/BANDAGES/DRESSINGS) ×2
APPLIER CLIP 11 MED OPEN (CLIP) ×3
APR CLP MED 11 20 MLT OPN (CLIP) ×2
BENZOIN TINCTURE PRP APPL 2/3 (GAUZE/BANDAGES/DRESSINGS) ×1 IMPLANT
BLADE CLIPPER SURG (BLADE) ×1 IMPLANT
BLADE SURG 10 STRL SS (BLADE) ×3 IMPLANT
CLIP APPLIE 11 MED OPEN (CLIP) ×4 IMPLANT
CLIP LIGATING EXTRA MED SLVR (CLIP) ×2 IMPLANT
CLIP LIGATING EXTRA SM BLUE (MISCELLANEOUS) ×3 IMPLANT
CORD BIPOLAR FORCEPS 12FT (ELECTRODE) ×3 IMPLANT
COVER SURGICAL LIGHT HANDLE (MISCELLANEOUS) ×4 IMPLANT
COVER WAND RF STERILE (DRAPES) ×4 IMPLANT
DERMABOND ADVANCED (GAUZE/BANDAGES/DRESSINGS)
DERMABOND ADVANCED .7 DNX12 (GAUZE/BANDAGES/DRESSINGS) ×2 IMPLANT
DRAPE C-ARM 42X72 X-RAY (DRAPES) ×5 IMPLANT
DRAPE POUCH INSTRU U-SHP 10X18 (DRAPES) ×3 IMPLANT
DRAPE SURG 17X23 STRL (DRAPES) ×10 IMPLANT
DRSG MEPILEX BORDER 4X12 (GAUZE/BANDAGES/DRESSINGS) ×2 IMPLANT
DURAPREP 26ML APPLICATOR (WOUND CARE) ×3 IMPLANT
ELECT BLADE 4.0 EZ CLEAN MEGAD (MISCELLANEOUS) ×3
ELECT CAUTERY BLADE 6.4 (BLADE) ×3 IMPLANT
ELECT REM PT RETURN 9FT ADLT (ELECTROSURGICAL) ×3
ELECTRODE BLDE 4.0 EZ CLN MEGD (MISCELLANEOUS) ×4 IMPLANT
ELECTRODE REM PT RTRN 9FT ADLT (ELECTROSURGICAL) ×2 IMPLANT
GAUZE 4X4 16PLY RFD (DISPOSABLE) ×1 IMPLANT
GAUZE SPONGE 4X4 12PLY STRL (GAUZE/BANDAGES/DRESSINGS) ×1 IMPLANT
GLOVE BIO SURGEON STRL SZ7 (GLOVE) ×3 IMPLANT
GLOVE BIO SURGEON STRL SZ8 (GLOVE) ×3 IMPLANT
GLOVE SRG 8 PF TXTR STRL LF DI (GLOVE) ×4 IMPLANT
GLOVE SS BIOGEL STRL SZ 7.5 (GLOVE) ×2 IMPLANT
GLOVE SUPERSENSE BIOGEL SZ 7.5 (GLOVE) ×1
GLOVE SURG UNDER POLY LF SZ7 (GLOVE) ×3 IMPLANT
GLOVE SURG UNDER POLY LF SZ8 (GLOVE) ×6
GOWN STRL REUS W/ TWL LRG LVL3 (GOWN DISPOSABLE) ×6 IMPLANT
GOWN STRL REUS W/ TWL XL LVL3 (GOWN DISPOSABLE) ×2 IMPLANT
GOWN STRL REUS W/TWL LRG LVL3 (GOWN DISPOSABLE) ×9
GOWN STRL REUS W/TWL XL LVL3 (GOWN DISPOSABLE) ×3
HEMOSTAT SURGICEL 2X14 (HEMOSTASIS) IMPLANT
INSERT FOGARTY 61MM (MISCELLANEOUS) IMPLANT
INSERT FOGARTY SM (MISCELLANEOUS) IMPLANT
IV CATH 14GX2 1/4 (CATHETERS) ×1 IMPLANT
KIT BASIN OR (CUSTOM PROCEDURE TRAY) ×3 IMPLANT
KIT TURNOVER KIT B (KITS) ×3 IMPLANT
LOOP VESSEL MAXI BLUE (MISCELLANEOUS) ×1 IMPLANT
LOOP VESSEL MINI RED (MISCELLANEOUS) IMPLANT
MIX DBX 10CC 35% BONE (Bone Implant) ×1 IMPLANT
NDL HYPO 25GX1X1/2 BEV (NEEDLE) ×2 IMPLANT
NDL SPNL 18GX3.5 QUINCKE PK (NEEDLE) ×2 IMPLANT
NEEDLE HYPO 25GX1X1/2 BEV (NEEDLE) ×3 IMPLANT
NEEDLE SPNL 18GX3.5 QUINCKE PK (NEEDLE) ×3 IMPLANT
NS IRRIG 1000ML POUR BTL (IV SOLUTION) ×3 IMPLANT
PACK LAMINECTOMY ORTHO (CUSTOM PROCEDURE TRAY) ×3 IMPLANT
PACK UNIVERSAL I (CUSTOM PROCEDURE TRAY) ×3 IMPLANT
PAD ARMBOARD 7.5X6 YLW CONV (MISCELLANEOUS) ×10 IMPLANT
PATTIES SURGICAL .5 X1 (DISPOSABLE) ×3 IMPLANT
SOL ANTI FOG 6CC (MISCELLANEOUS) IMPLANT
SOLUTION ANTI FOG 6CC (MISCELLANEOUS) ×1
SPACER ALIF EXP MAG 26X34 8D (Spacer) ×2 IMPLANT
SPONGE INTESTINAL PEANUT (DISPOSABLE) ×9 IMPLANT
SPONGE LAP 18X18 RF (DISPOSABLE) ×1 IMPLANT
SPONGE LAP 4X18 RFD (DISPOSABLE) IMPLANT
SPONGE SURGIFOAM ABS GEL 100 (HEMOSTASIS) ×5 IMPLANT
STAPLER VISISTAT 35W (STAPLE) IMPLANT
STRIP CLOSURE SKIN 1/2X4 (GAUZE/BANDAGES/DRESSINGS) IMPLANT
STRIP CLOSURE SKIN 1/4X4 (GAUZE/BANDAGES/DRESSINGS) ×1 IMPLANT
SURGIFLO W/THROMBIN 8M KIT (HEMOSTASIS) IMPLANT
SUT MNCRL AB 4-0 PS2 18 (SUTURE) ×3 IMPLANT
SUT PDS AB 1 CTX 36 (SUTURE) ×6 IMPLANT
SUT PROLENE 4 0 RB 1 (SUTURE)
SUT PROLENE 4-0 RB1 .5 CRCL 36 (SUTURE) IMPLANT
SUT PROLENE 5 0 C 1 24 (SUTURE) IMPLANT
SUT PROLENE 5 0 CC1 (SUTURE) IMPLANT
SUT PROLENE 6 0 C 1 30 (SUTURE) ×3 IMPLANT
SUT PROLENE 6 0 CC (SUTURE) IMPLANT
SUT SILK 0 TIES 10X30 (SUTURE) ×3 IMPLANT
SUT SILK 2 0 TIES 10X30 (SUTURE) ×5 IMPLANT
SUT SILK 2 0SH CR/8 30 (SUTURE) IMPLANT
SUT SILK 3 0 TIES 10X30 (SUTURE) ×5 IMPLANT
SUT SILK 3 0SH CR/8 30 (SUTURE) IMPLANT
SUT VIC AB 1 CT1 27 (SUTURE) ×6
SUT VIC AB 1 CT1 27XBRD ANBCTR (SUTURE) ×4 IMPLANT
SUT VIC AB 1 CTX 36 (SUTURE) ×6
SUT VIC AB 1 CTX36XBRD ANBCTR (SUTURE) ×4 IMPLANT
SUT VIC AB 2-0 CT2 18 VCP726D (SUTURE) ×4 IMPLANT
SYR BULB IRRIG 60ML STRL (SYRINGE) ×3 IMPLANT
SYR CONTROL 10ML LL (SYRINGE) ×2 IMPLANT
TOWEL GREEN STERILE (TOWEL DISPOSABLE) ×6 IMPLANT
TOWEL GREEN STERILE FF (TOWEL DISPOSABLE) ×3 IMPLANT
TRAY FOLEY W/BAG SLVR 16FR (SET/KITS/TRAYS/PACK) ×3
TRAY FOLEY W/BAG SLVR 16FR ST (SET/KITS/TRAYS/PACK) ×2 IMPLANT
WATER STERILE IRR 1000ML POUR (IV SOLUTION) ×2 IMPLANT
YANKAUER SUCT BULB TIP NO VENT (SUCTIONS) ×3 IMPLANT

## 2020-06-09 NOTE — Transfer of Care (Signed)
Immediate Anesthesia Transfer of Care Note  Patient: Katrina Vasquez  Procedure(s) Performed: LUMBAR FOUR - LUMBAR FIVE , LUMBAR FIVE - SACRUM ONE  ANTERIOR LUMBAR INTERBODY FUSION WITH INSTRUMENTATION AND ALLOGRAFT (N/A Spine Lumbar) ABDOMINAL EXPOSURE (N/A Abdomen)  Patient Location: PACU  Anesthesia Type:General  Level of Consciousness: drowsy, patient cooperative and responds to stimulation  Airway & Oxygen Therapy: Patient Spontanous Breathing and Patient connected to face mask oxygen  Post-op Assessment: Report given to RN, Post -op Vital signs reviewed and stable and Patient moving all extremities X 4  Post vital signs: Reviewed and stable  Last Vitals:  Vitals Value Taken Time  BP    Temp    Pulse    Resp    SpO2      Last Pain:  Vitals:   06/09/20 0643  TempSrc:   PainSc: 0-No pain      Patients Stated Pain Goal: 3 (64/40/34 7425)  Complications: No complications documented.

## 2020-06-09 NOTE — Anesthesia Preprocedure Evaluation (Addendum)
Anesthesia Evaluation  Patient identified by MRN, date of birth, ID band Patient awake    Reviewed: Allergy & Precautions, NPO status , Patient's Chart, lab work & pertinent test results, reviewed documented beta blocker date and time   History of Anesthesia Complications Negative for: history of anesthetic complications  Airway Mallampati: II  TM Distance: >3 FB Neck ROM: Full    Dental  (+) Edentulous Lower, Edentulous Upper   Pulmonary former smoker,    Pulmonary exam normal        Cardiovascular hypertension, Pt. on home beta blockers and Pt. on medications Normal cardiovascular exam  TTE 06/08/2020: EF 63%, abnormal septal wall motion due to LBBB, grade I DD   Neuro/Psych  Headaches, Lumbar stenosis negative psych ROS   GI/Hepatic Neg liver ROS, GERD  Controlled and Medicated,  Endo/Other  negative endocrine ROS  Renal/GU negative Renal ROS  negative genitourinary   Musculoskeletal  (+) Arthritis ,   Abdominal   Peds  Hematology negative hematology ROS (+)   Anesthesia Other Findings Day of surgery medications reviewed with patient.  Reproductive/Obstetrics negative OB ROS                            Anesthesia Physical Anesthesia Plan  ASA: II  Anesthesia Plan: General   Post-op Pain Management:    Induction: Intravenous  PONV Risk Score and Plan: 4 or greater and Treatment may vary due to age or medical condition, Ondansetron, Dexamethasone and Midazolam  Airway Management Planned: Oral ETT  Additional Equipment: None  Intra-op Plan:   Post-operative Plan: Extubation in OR  Informed Consent: I have reviewed the patients History and Physical, chart, labs and discussed the procedure including the risks, benefits and alternatives for the proposed anesthesia with the patient or authorized representative who has indicated his/her understanding and acceptance.     Dental  advisory given  Plan Discussed with: CRNA  Anesthesia Plan Comments:        Anesthesia Quick Evaluation

## 2020-06-09 NOTE — Op Note (Signed)
PATIENT NAME: Katrina Vasquez   MEDICAL RECORD NO.:   [id number]    DATE OF BIRTH: 1960-03-23   DATE OF PROCEDURE: 06/09/2020                               OPERATIVE REPORT    PREOPERATIVE DIAGNOSES: 1. Bilateral lumbar radiculopathy. 2. L4/5, L5/S1 spinal stenosis 3. L4-5, L5-S1 degenerative disk disease.   POSTOPERATIVE DIAGNOSES: 1. Bilateral lumbar radiculopathy. 2. L4/5, L5/S1 spinal stenosis 3. L4-5, L5-S1 degenerative disk disease.   PROCEDURE: 1. Anterior lumbar interbody fusion, L4-5, L5-S1 (expsure peformed by Dr. Sherren Mocha Early) 2. Insertion of interbody device x2 (Magnify expandable Globus spacers) 3. Placement of anterior instrumentation securing the L5-S1 level 4. Intraoperative use of fluoroscopy. 5. Use of morselized allograft - DBX-mix 6. Co-surgeon with Dr. Sherren Mocha Early for retroperitoneal exposure   SURGEON:  Phylliss Bob, MD   ASSISTANT:  Pricilla Holm, PA-C.   ANESTHESIA:  General endotracheal anesthesia.   COMPLICATIONS:  None.   DISPOSITION:  Stable.   ESTIMATED BLOOD LOSS:  Minimal.   INDICATIONS FOR SURGERY:  Briefly, Katrina Vasquez is a very pleasant 60 year old female, who has been having progressive debilitating pain in the bilateral legs and low back. The patient did fail appropriate nonoperative measures, but did continue to have significant pain. Given her ongoing pain and dysfunction, we did discuss proceeding with the procedure noted above. The patient was fully aware of the risks and limitations associated with surgery, and did wish to proceed. The plan was to proceed with stage I of her procedure today, and to return tomorrow for stage II, specifically, a posterior fusion, and potentially a posterior decompression.      OPERATIVE DETAILS:  On 06/09/2020, the patient was brought to surgery and general endotracheal anesthesia was administered. The patient was placed supine on the hospital bed. The patient's abdomen was prepped and draped in the  usual sterile fashion.  An anterior retroperitoneal approach was then performed by Dr. Curt Jews.  I did function as his co-surgeon during the approach. Once the anterior lumbar spine was noted, we did focus our attention on the L5-S1 intervertebral space.  I then performed a thorough and complete L5-S1 intervertebral diskectomy to the level of the posterior longitudinal ligament.  Of note, the intervertebral disc space was very collapsed.  I was however very pleased with the diskectomy that I was able to accomplish.  At this point, given the significant kyphosis and collapse across the intervertebral space, I did utilize an expandable trial, in order to optimize restoration of intervertebral height.  After doing this, the endplates were appropriately prepared, and I then placed a series of trials, after which point an expandable spacer was packed with DBX-mix and inserted and expanded in the usual fashion, up to approximately 10.5 mm in height.  I was very pleased with the resting position of the implant. I then proceeded with placement of anterior instrumentation at the L5-S1 intervertebral space.  To accomplish this, I did use an awl to prepare the trajectory of anterior vertebral body screw.  An anterior fixation device was attached to the back end of the screw and did provide anterior fixation across the L5-S1 intervertebral space, securing the intervertebral implant into place.  I was very pleased with the press-fit of the anterior hardware.  Dr. Donnetta Hutching then scrubbed back into the case, and repositioned the retractors over the L4/5 intervertebral space.    I  then performed a thorough and complete L4/5 intervertebral diskectomy to the level of the posterior longitudinal ligament.  I was very pleased with the diskectomy that I was able to accomplish.  At this point, given the significant kyphosis and collapse across the L4-5 intervertebral space, I did again utilize an expandable trial, in order to  optimize restoration of intervertebral height, as was done at the L5-S1 level.  After doing this, the endplates were appropriately prepared, and I then placed a series of trials, after which point an expandable spacer was packed with DBX-mix and inserted and expanded in the usual fashion, up to approximately 10 mm in height.  I was very pleased with the resting position of the implant.  Dr. Donnetta Hutching then scrubbed back into the case, and was pleased with the hemostasis that was achieved.   I did liberally use AP and lateral fluoroscopy to ensure that the implants and anterior fixation was in the appropriate position, and was very pleased with the radiographs.  The wound was copiously irrigated.  The fascia was  closed using #1 PDS.  The subcutaneous layer was closed using 0 Vicryl followed by 2-0 Vicryl, and the skin was closed using 4-0 Monocryl. Benzoin and Steri-Strips were applied followed by sterile dressing.   All instrument counts were correct at the termination of the procedure.   Of note, Pricilla Holm was my assistant throughout surgery, and did aid in retraction, suctioning, placement of the hardware, and closure for both the anterior and posterior portions of the procedure.     Phylliss Bob, MD

## 2020-06-09 NOTE — OR Nursing (Signed)
Report called by Dr. Jasmine December, Radiologist; no retained objects seen on X-ray.

## 2020-06-09 NOTE — Anesthesia Procedure Notes (Signed)
Procedure Name: Intubation Date/Time: 06/09/2020 8:56 AM Performed by: Michele Rockers, CRNA Pre-anesthesia Checklist: Patient identified, Patient being monitored, Timeout performed, Emergency Drugs available and Suction available Patient Re-evaluated:Patient Re-evaluated prior to induction Oxygen Delivery Method: Circle System Utilized Preoxygenation: Pre-oxygenation with 100% oxygen Induction Type: IV induction Ventilation: Mask ventilation without difficulty Laryngoscope Size: Miller and 2 Grade View: Grade I Tube type: Oral Tube size: 7.0 mm Number of attempts: 1 Airway Equipment and Method: Stylet Placement Confirmation: ETT inserted through vocal cords under direct vision,  positive ETCO2 and breath sounds checked- equal and bilateral Secured at: 21 cm Tube secured with: Tape Dental Injury: Teeth and Oropharynx as per pre-operative assessment

## 2020-06-09 NOTE — Progress Notes (Signed)
Per patient she has a new prescription for Metoprolol that she has not picked up or started yet. Per patient she wants to consult with her PCP before she begins the med. Prescription was sent on 06/07/20. Dr. Ermalene Postin made aware that patient has not started Metoprolol. Per Dr. Ermalene Postin ok to hold Metoprolol prior to surgery.

## 2020-06-09 NOTE — H&P (Signed)
PREOPERATIVE H&P  Chief Complaint: Low back pain, bilateral leg pain  HPI: Katrina Vasquez is a 60 y.o. female who presents with ongoing pain in the back and legs  MRI reveals severe stenosis and severe DDD from L4-S1  Patient has failed multiple forms of conservative care and continues to have pain (see office notes for additional details regarding the patient's full course of treatment)  Past Medical History:  Diagnosis Date  . Arthritis    back  . Back pain   . Cancer ([place])    cervical in remission  . Carpal tunnel syndrome   . Chronic pain   . Dyspnea   . GERD (gastroesophageal reflux disease)   . Headache   . History of kidney stones   . Hypercholesteremia   . Hypertension   . Leg pain    Bilateral  . Pneumonia   . Thrombosis of left renal vein ([place]) 09/08/2015   Past Surgical History:  Procedure Laterality Date  . ABDOMINAL HYSTERECTOMY    . ABDOMINAL SURGERY    . APPENDECTOMY    . BACK SURGERY    . bowel recontruction     1988  . CHOLECYSTECTOMY     Social History   Socioeconomic History  . Marital status: Widowed    Spouse name: Not on file  . Number of children: Not on file  . Years of education: Not on file  . Highest education level: Not on file  Occupational History  . Not on file  Tobacco Use  . Smoking status: Former Smoker    Years: 2.00    Types: Cigarettes, Cigars    Quit date: 2000    Years since quitting: 22.3  . Smokeless tobacco: Never Used  Vaping Use  . Vaping Use: Never used  Substance and Sexual Activity  . Alcohol use: No  . Drug use: No  . Sexual activity: Yes    Birth control/protection: Surgical  Other Topics Concern  . Not on file  Social History Narrative  . Not on file   Social Determinants of Health   Financial Resource Strain: Not on file  Food Insecurity: Not on file  Transportation Needs: Not on file  Physical Activity: Not on file  Stress: Not on file  Social Connections: Not on file   Family  History  Problem Relation Age of Onset  . Kidney disease Mother   . Arthritis Mother   . Heart disease Father    Allergies  Allergen Reactions  . Percocet [Oxycodone-Acetaminophen] Nausea And Vomiting  . Hydroxyzine Hcl Itching  . Tramadol Nausea And Vomiting   Prior to Admission medications   Medication Sig Start Date End Date Taking? Authorizing Provider  acetaminophen (TYLENOL) 500 MG tablet Take 500-1,000 mg by mouth every 6 (six) hours as needed for moderate pain or headache.   Yes [provider]  amitriptyline (ELAVIL) 25 MG tablet Take 75 mg by mouth at bedtime. 07/22/15  Yes [provider]  baclofen (LIORESAL) 10 MG tablet Take 10 mg by mouth 3 (three) times daily.   Yes [provider]  HYDROcodone-acetaminophen (NORCO) 7.5-325 MG tablet Take 1 tablet by mouth 4 (four) times daily. 05/02/20  Yes [provider]  HYSINGLA ER 60 MG T24A Take 60 mg by mouth daily. 01/06/20  Yes [provider]  lisinopril (ZESTRIL) 10 MG tablet Take 1 tablet (10 mg total) by mouth daily. 06/07/20 09/05/20 Yes Adrian Prows, MD  lubiprostone (AMITIZA) 24 MCG capsule Take  24 mcg by mouth 2 (two) times daily. 03/26/20  Yes [provider]  promethazine (PHENERGAN) 25 MG tablet Take 25 mg by mouth in the morning, at noon, and at bedtime. 03/22/18  Yes [provider]  rosuvastatin (CRESTOR) 10 MG tablet Take 10 mg by mouth daily.   Yes [provider]  sucralfate (CARAFATE) 1 g tablet Take 1 g by mouth 2 (two) times daily. 02/20/20  Yes [provider]  VENTOLIN HFA 108 (90 Base) MCG/ACT inhaler Inhale 2 puffs into the lungs 4 (four) times daily as needed for shortness of breath.  12/25/16  Yes [provider]  metoprolol succinate (TOPROL-XL) 50 MG 24 hr tablet Take 1 tablet (50 mg total) by mouth daily after supper. Take with or immediately following a meal. 06/07/20 09/05/20  Adrian Prows, MD     All other systems have been  reviewed and were otherwise negative with the exception of those mentioned in the HPI and as above.  Physical Exam: Vitals:   06/09/20 0621  BP: 120/71  Pulse: 91  Resp: 18  Temp: 98.1 F (36.7 C)  SpO2: 99%    Body mass index is 24.62 kg/m.  General: Alert, no acute distress Cardiovascular: No pedal edema Respiratory: No cyanosis, no use of accessory musculature Skin: No lesions in the area of chief complaint Neurologic: Sensation intact distally Psychiatric: Patient is competent for consent with normal mood and affect Lymphatic: No axillary or cervical lymphadenopathy  Assessment/Plan: SEVERE DEGENERATIVE DISC DISEASE AT LUMBAR 4 - LUMBAR 5 AND LUMBAR 5 - SACRUM 1, IN ADDITION TO SPINAL STENOSIS  Plan for Procedure(s): LUMBAR 4 - LUMBAR 5 , LUMBAR 5 - SACRUM 1 ANTERIOR LUMBAR INTERBODY FUSION WITH INSTRUMENTATION AND ALLOGRAFT   Norva Karvonen, MD 06/09/2020 7:04 AM

## 2020-06-09 NOTE — Op Note (Signed)
    OPERATIVE REPORT  DATE OF SURGERY: 06/09/2020  PATIENT: Katrina Vasquez, 60 y.o. female MRN: [id number]  DOB: December 17, 1960  PRE-OPERATIVE DIAGNOSIS: Degenerative disc disease  POST-OPERATIVE DIAGNOSIS:  Same  PROCEDURE: Anterior exposure for L4-5 and L5-S1 lumbar fusion  SURGEON:  Curt Jews, M.D. Co-surgeon Dr. Lynann Bologna   The assistant was needed for exposure and to expedite the case  ANESTHESIA: General  EBL: per anesthesia record  Total I/O In: 2650 [P.O.:100; I.V.:1700; Other:500; IV Piggyback:350] Out: 500 [Urine:300; Blood:200]  BLOOD ADMINISTERED: none  DRAINS: none  SPECIMEN: none  COUNTS CORRECT:  YES  PATIENT DISPOSITION:  PACU - hemodynamically stable  PROCEDURE DETAILS: Patient was taken operating placed supine position with area of the abdomen from draped in sterile fashion.  Crosstable lateral images were used to reveal the level of the L4-5 and L5-S1 disc.  A left paramedian incision was made and carried down to the level of the rectus muscle.  The anterior rectus sheath was opened in line with the skin incision.  The rectus muscle was mobilized medially.  The patient had multiple prior abdominal surgeries.  The peritoneal cavity was entered and attempting to stay in the retroperitoneal space.  There was adhesions throughout these areas with essentially no plane.  The large retroperitoneal opening was not closed.  Dissection was continued to mobilize the iliac vessels.  Initially the L5-S1 disc was exposed by locating the disc between the level of the iliac vessels.  Middle sacral vessels were clipped and divided.  The blunt dissection was used to give exposure to the right and left of the L5 5 S1 disc.  Next the iliac vessels were mobilized from the left to the right to give exposure of the L4-5 disc.  The iliolumbar vein was ligated and divided.  There were several segmental branches as well and these were ligated and divided.  The Thompson retractor was  brought onto the field and the reverse lip 150 blades were positioned to the right and left of the L5-S1 disc.  Malleable retractors were used for superior and inferior exposure.  The L5-S1 discectomy and fusion will be dictated as a separate note with Dr. Lynann Bologna.  I then scrubbed back into the case and mobilized the iliac vessels to the right.  Again the reverse lip blades were positioned to the right and left of the L4-5 disc.  The discectomy and the remainder the procedure will be dictated as a separate note by Dr. Levora Dredge, M.D., [facility] 06/09/2020 3:47 PM  Note: Portions of this report may have been transcribed using voice recognition software.  Every effort has been made to ensure accuracy; however, inadvertent computerized transcription errors may still be present.

## 2020-06-10 ENCOUNTER — Inpatient Hospital Stay (HOSPITAL_COMMUNITY): Payer: Medicare Other | Admitting: Anesthesiology

## 2020-06-10 ENCOUNTER — Inpatient Hospital Stay (HOSPITAL_COMMUNITY): Payer: Medicare Other

## 2020-06-10 ENCOUNTER — Inpatient Hospital Stay (HOSPITAL_COMMUNITY)
Admission: RE | Disposition: A | Discharge status: Home / Self Care | Payer: Self-pay | Source: Home / Self Care | Attending: Orthopedic Surgery

## 2020-06-10 ENCOUNTER — Encounter (HOSPITAL_COMMUNITY): Payer: Self-pay | Admitting: Orthopedic Surgery

## 2020-06-10 ENCOUNTER — Inpatient Hospital Stay (HOSPITAL_COMMUNITY): Admission: RE | Admit: 2020-06-10 | Payer: Medicare Other | Source: Home / Self Care | Admitting: Orthopedic Surgery

## 2020-06-10 SURGERY — POSTERIOR LUMBAR FUSION 2 LEVEL
Anesthesia: General

## 2020-06-10 MED ORDER — BUPIVACAINE-EPINEPHRINE 0.25% -1:200000 IJ SOLN
INTRAMUSCULAR | Status: DC | PRN
  Administered 2020-06-10: 20 mL

## 2020-06-10 MED ORDER — ACETAMINOPHEN 10 MG/ML IV SOLN
INTRAVENOUS | Status: AC
  Filled 2020-06-10: qty 100

## 2020-06-10 MED ORDER — PROMETHAZINE HCL 25 MG/ML IJ SOLN: 6.2500 mg | INTRAMUSCULAR | Status: DC | PRN

## 2020-06-10 MED ORDER — ROCURONIUM 10MG/ML (10ML) SYRINGE FOR MEDFUSION PUMP - OPTIME
INTRAVENOUS | Status: DC | PRN
  Administered 2020-06-10: 100 mg via INTRAVENOUS

## 2020-06-10 MED ORDER — THROMBIN 20000 UNITS EX SOLR
CUTANEOUS | Status: DC | PRN
  Administered 2020-06-10: 20000 [IU] via TOPICAL

## 2020-06-10 MED ORDER — CEFAZOLIN SODIUM-DEXTROSE 2-3 GM-%(50ML) IV SOLR
INTRAVENOUS | Status: DC | PRN
  Administered 2020-06-10: 2 g via INTRAVENOUS

## 2020-06-10 MED ORDER — MIDAZOLAM HCL 2 MG/2ML IJ SOLN
INTRAMUSCULAR | Status: DC | PRN
  Administered 2020-06-10: 2 mg via INTRAVENOUS

## 2020-06-10 MED ORDER — DEXAMETHASONE SODIUM PHOSPHATE 10 MG/ML IJ SOLN
INTRAMUSCULAR | Status: DC | PRN
  Administered 2020-06-10: 10 mg via INTRAVENOUS

## 2020-06-10 MED ORDER — HYDROMORPHONE HCL 1 MG/ML IJ SOLN
0.2500 mg | INTRAMUSCULAR | Status: DC | PRN
  Administered 2020-06-10 (×4): 0.5 mg via INTRAVENOUS

## 2020-06-10 MED ORDER — HYDROMORPHONE HCL 1 MG/ML IJ SOLN
0.2500 mg | INTRAMUSCULAR | Status: DC | PRN
  Administered 2020-06-10 (×2): 0.5 mg via INTRAVENOUS

## 2020-06-10 MED ORDER — KETAMINE HCL 50 MG/5ML IJ SOSY
PREFILLED_SYRINGE | INTRAMUSCULAR | Status: AC
  Filled 2020-06-10: qty 5

## 2020-06-10 MED ORDER — PHENYLEPHRINE 40 MCG/ML (10ML) SYRINGE FOR IV PUSH (FOR BLOOD PRESSURE SUPPORT)
PREFILLED_SYRINGE | INTRAVENOUS | Status: DC | PRN
  Administered 2020-06-10 (×2): 80 ug via INTRAVENOUS

## 2020-06-10 MED ORDER — BUPIVACAINE LIPOSOME 1.3 % IJ SUSP
INTRAMUSCULAR | Status: DC | PRN
  Administered 2020-06-10: 20 mL

## 2020-06-10 MED ORDER — CEFAZOLIN SODIUM-DEXTROSE 2-4 GM/100ML-% IV SOLN
INTRAVENOUS | Status: AC
  Filled 2020-06-10: qty 100

## 2020-06-10 MED ORDER — LACTATED RINGERS IV SOLN: INTRAVENOUS | Status: DC | PRN

## 2020-06-10 MED ORDER — PROPOFOL 10 MG/ML IV BOLUS
INTRAVENOUS | Status: DC | PRN
  Administered 2020-06-10: 120 mg via INTRAVENOUS

## 2020-06-10 MED ORDER — HYDROMORPHONE HCL 1 MG/ML IJ SOLN
INTRAMUSCULAR | Status: AC
  Filled 2020-06-10: qty 1

## 2020-06-10 MED ORDER — ONDANSETRON HCL 4 MG/2ML IJ SOLN
INTRAMUSCULAR | Status: DC | PRN
  Administered 2020-06-10: 4 mg via INTRAVENOUS

## 2020-06-10 MED ORDER — ACETAMINOPHEN 10 MG/ML IV SOLN
INTRAVENOUS | Status: DC | PRN
  Administered 2020-06-10: 1000 mg via INTRAVENOUS

## 2020-06-10 MED ORDER — LIDOCAINE HCL (CARDIAC) PF 100 MG/5ML IV SOSY
PREFILLED_SYRINGE | INTRAVENOUS | Status: DC | PRN
  Administered 2020-06-10: 80 mg via INTRAVENOUS

## 2020-06-10 MED ORDER — SUFENTANIL CITRATE 50 MCG/ML IV SOLN
INTRAVENOUS | Status: DC | PRN
  Administered 2020-06-10: 10 ug via INTRAVENOUS

## 2020-06-10 MED ORDER — TIZANIDINE HCL 4 MG PO TABS
4.0000 mg | ORAL_TABLET | Freq: Three times a day (TID) | ORAL | Status: DC | PRN
  Administered 2020-06-10 – 2020-06-11 (×2): 4 mg via ORAL
  Filled 2020-06-10 (×2): qty 1

## 2020-06-10 MED ORDER — SUGAMMADEX SODIUM 200 MG/2ML IV SOLN
INTRAVENOUS | Status: DC | PRN
  Administered 2020-06-10: 200 mg via INTRAVENOUS

## 2020-06-10 MED ORDER — ENSURE PRE-SURGERY PO LIQD
296.0000 mL | Freq: Once | ORAL | Status: AC
  Administered 2020-06-10: 296 mL via ORAL
  Filled 2020-06-10: qty 296

## 2020-06-10 MED ORDER — KETAMINE HCL 10 MG/ML IJ SOLN
INTRAMUSCULAR | Status: DC | PRN
  Administered 2020-06-10 (×3): 10 mg via INTRAVENOUS

## 2020-06-10 MED ORDER — 0.9 % SODIUM CHLORIDE (POUR BTL) OPTIME
TOPICAL | Status: DC | PRN
  Administered 2020-06-10: 1000 mL

## 2020-06-10 SURGICAL SUPPLY — 78 items
AGENT HMST KT MTR STRL THRMB (HEMOSTASIS)
APL SKNCLS STERI-STRIP NONHPOA (GAUZE/BANDAGES/DRESSINGS) ×2
BENZOIN TINCTURE PRP APPL 2/3 (GAUZE/BANDAGES/DRESSINGS) ×2 IMPLANT
BUR ROUND PRECISION 4.0 (BURR) ×1 IMPLANT
CARTRIDGE OIL MAESTRO DRILL (MISCELLANEOUS) ×2 IMPLANT
CLSR STERI-STRIP ANTIMIC 1/2X4 (GAUZE/BANDAGES/DRESSINGS) ×1 IMPLANT
CNTNR URN SCR LID CUP LEK RST (MISCELLANEOUS) ×1 IMPLANT
CONT SPEC 4OZ STRL OR WHT (MISCELLANEOUS) ×2
COVER SURGICAL LIGHT HANDLE (MISCELLANEOUS) ×2 IMPLANT
COVER WAND RF STERILE (DRAPES) ×2 IMPLANT
DIFFUSER DRILL AIR PNEUMATIC (MISCELLANEOUS) ×4 IMPLANT
DRAIN CHANNEL 15F RND FF W/TCR (WOUND CARE) ×2 IMPLANT
DRAPE C-ARM 42X72 X-RAY (DRAPES) ×2 IMPLANT
DRAPE POUCH INSTRU U-SHP 10X18 (DRAPES) ×2 IMPLANT
DRAPE SURG 17X23 STRL (DRAPES) ×8 IMPLANT
DURAPREP 26ML APPLICATOR (WOUND CARE) ×2 IMPLANT
ELECT BLADE 4.0 EZ CLEAN MEGAD (MISCELLANEOUS) ×2
ELECT CAUTERY BLADE 6.4 (BLADE) ×4 IMPLANT
ELECT REM PT RETURN 9FT ADLT (ELECTROSURGICAL) ×2
ELECTRODE BLDE 4.0 EZ CLN MEGD (MISCELLANEOUS) ×1 IMPLANT
ELECTRODE REM PT RTRN 9FT ADLT (ELECTROSURGICAL) ×1 IMPLANT
EVACUATOR SILICONE 100CC (DRAIN) ×2 IMPLANT
GAUZE 4X4 16PLY RFD (DISPOSABLE) ×4 IMPLANT
GAUZE SPONGE 4X4 12PLY STRL (GAUZE/BANDAGES/DRESSINGS) ×2 IMPLANT
GLOVE BIO SURGEON STRL SZ7 (GLOVE) ×2 IMPLANT
GLOVE BIO SURGEON STRL SZ8 (GLOVE) ×2 IMPLANT
GLOVE SRG 8 PF TXTR STRL LF DI (GLOVE) ×1 IMPLANT
GLOVE SURG UNDER POLY LF SZ7 (GLOVE) ×2 IMPLANT
GLOVE SURG UNDER POLY LF SZ8 (GLOVE) ×2
GOWN STRL REUS W/ TWL LRG LVL3 (GOWN DISPOSABLE) ×2 IMPLANT
GOWN STRL REUS W/ TWL XL LVL3 (GOWN DISPOSABLE) ×1 IMPLANT
GOWN STRL REUS W/TWL LRG LVL3 (GOWN DISPOSABLE) ×4
GOWN STRL REUS W/TWL XL LVL3 (GOWN DISPOSABLE) ×2
GUIDEWIRE SHARP VIPER II (WIRE) ×6 IMPLANT
IV CATH 14GX2 1/4 (CATHETERS) ×2 IMPLANT
KIT BASIN OR (CUSTOM PROCEDURE TRAY) ×2 IMPLANT
KIT POSITION SURG JACKSON T1 (MISCELLANEOUS) ×2 IMPLANT
KIT TURNOVER KIT B (KITS) ×2 IMPLANT
MARKER SKIN DUAL TIP RULER LAB (MISCELLANEOUS) ×2 IMPLANT
NDL HYPO 25GX1X1/2 BEV (NEEDLE) ×1 IMPLANT
NDL SPNL 18GX3.5 QUINCKE PK (NEEDLE) ×2 IMPLANT
NEEDLE HYPO 25GX1X1/2 BEV (NEEDLE) ×2 IMPLANT
NEEDLE SPNL 18GX3.5 QUINCKE PK (NEEDLE) ×4 IMPLANT
NS IRRIG 1000ML POUR BTL (IV SOLUTION) ×2 IMPLANT
OIL CARTRIDGE MAESTRO DRILL (MISCELLANEOUS) ×4
PACK LAMINECTOMY ORTHO (CUSTOM PROCEDURE TRAY) ×2 IMPLANT
PACK UNIVERSAL I (CUSTOM PROCEDURE TRAY) ×2 IMPLANT
PAD ARMBOARD 7.5X6 YLW CONV (MISCELLANEOUS) ×4 IMPLANT
PATTIES SURGICAL .5 X1 (DISPOSABLE) ×2 IMPLANT
PATTIES SURGICAL .5X1.5 (GAUZE/BANDAGES/DRESSINGS) ×2 IMPLANT
PATTIES SURGICAL .75X.75 (GAUZE/BANDAGES/DRESSINGS) ×2 IMPLANT
PUTTY DBX 1CC (Putty) ×2 IMPLANT
PUTTY DBX 1CC DEPUY (Putty) IMPLANT
ROD VIPOR2 70MM PRE LARDOSED (Rod) ×2 IMPLANT
SCREW POLY VIPER2 7X40MM (Screw) ×6 IMPLANT
SCREW SET SINGLE INNER MIS (Screw) ×6 IMPLANT
SPONGE SURGIFOAM ABS GEL 100 (HEMOSTASIS) ×1 IMPLANT
STRIP CLOSURE SKIN 1/2X4 (GAUZE/BANDAGES/DRESSINGS) ×1 IMPLANT
SURGIFLO W/THROMBIN 8M KIT (HEMOSTASIS) IMPLANT
SUT MNCRL AB 4-0 PS2 18 (SUTURE) ×3 IMPLANT
SUT VIC AB 0 CT1 18XCR BRD 8 (SUTURE) ×2 IMPLANT
SUT VIC AB 0 CT1 8-18 (SUTURE) ×4
SUT VIC AB 1 CT1 18XBRD ANBCTR (SUTURE) IMPLANT
SUT VIC AB 1 CT1 18XCR BRD 8 (SUTURE) ×2 IMPLANT
SUT VIC AB 1 CT1 8-18 (SUTURE) ×2
SUT VIC AB 2-0 CT2 18 VCP726D (SUTURE) ×5 IMPLANT
SYR 20ML LL LF (SYRINGE) ×2 IMPLANT
SYR BULB IRRIG 60ML STRL (SYRINGE) ×2 IMPLANT
SYR CONTROL 10ML LL (SYRINGE) ×4 IMPLANT
SYR TB 1ML LUER SLIP (SYRINGE) ×2 IMPLANT
TAP CANN VIPER2 DL 6.0 (TAP) ×2 IMPLANT
TAP CANN VIPER2 DL 7.0 (TAP) ×1 IMPLANT
TAPE CLOTH SURG 4X10 WHT LF (GAUZE/BANDAGES/DRESSINGS) ×1 IMPLANT
TOWEL GREEN STERILE (TOWEL DISPOSABLE) ×2 IMPLANT
TOWEL GREEN STERILE FF (TOWEL DISPOSABLE) ×2 IMPLANT
TRAY FOLEY MTR SLVR 16FR STAT (SET/KITS/TRAYS/PACK) ×2 IMPLANT
WATER STERILE IRR 1000ML POUR (IV SOLUTION) ×2 IMPLANT
YANKAUER SUCT BULB TIP NO VENT (SUCTIONS) ×2 IMPLANT

## 2020-06-10 NOTE — Progress Notes (Signed)
Patient enroute to OR for stage 2 surgery via bed accompanied by OR tech. Report given to V.Nurse, mental health. Patient in no signs of distress prior to transfer to OR.

## 2020-06-10 NOTE — Progress Notes (Signed)
Patient surgical back pain uncontrolled, continuously crying d/t severe pain. . Ordered pain meds not helping per patient. On call PA Grier Mitts notified. New order for dilaudid IV given. Patient expressed relief from new pain med order, able to walk and move better after medication was given.

## 2020-06-10 NOTE — Op Note (Signed)
PATIENT NAME: Katrina Vasquez   MEDICAL RECORD NO.:   [id number]    DATE OF BIRTH: 03/25/1960   DATE OF PROCEDURE: 06/10/2020                               OPERATIVE REPORT     PREOPERATIVE DIAGNOSES: 1.  Status post L4-5 and L5-S1 anterior lumbar fusion on 06/09/2020, requiring posterior fusion with instrumentation   POSTOPERATIVE DIAGNOSES:   1.  Status post L4-5 and L5-S1 anterior lumbar fusion on 06/09/2020, requiring posterior fusion with instrumentation     PROCEDURE (Stage 2 of 2): 1. Posterior spinal fusion, L4-5, L5-S1. 2. Placement of posterior segmental instrumentation, L4, L5, S1 bilaterally. 3. Use of morselized allograft: DBX-putty 4. Intraoperative use of floroscopy   SURGEON:  Phylliss Bob, MD   ASSISTANT:  Pricilla Holm, PA-C   ANESTHESIA:  General endotracheal anesthesia.   COMPLICATIONS:  None.   DISPOSITION:  Stable.   ESTIMATED BLOOD LOSS:  Minimal   INDICATIONS FOR SURGERY: Briefly, [name] is one day status post an anterior lumbar fusion as noted above.  Please refer to my operative report dated 06/09/2020, for a full account of the patient's preoperative history and indications for surgery.  The patient did present today for stage 2 of what was to be a 2-staged procedure.   OPERATIVE DETAILS:  On 06/10/2020, the patient was brought to surgery and general endotracheal anesthesia was administered.  The patient was placed prone onto a Jackson spinal bed.  The back was then prepped and draped in the usual sterile fashion.  I then made paramedian incisions on the right and left sides, just lateral to the lateral borders of the pedicles at L4-5 and L5-S1.  On the left side, the posterolateral gutter and posterior elements at the L4-5 and L5-S1 levels were identified and exposed and decorticated.  DBX-putty was packed into the posterolateral gutter on the left side to aid in the success of the posterior fusion.  I then tapped the L4, L5, and S1 pedicles  bilaterally using a 6 mm tap.  I then placed 7 x 40 mm screws bilaterally at S1 and 7 x 40 mm screws bilaterally at L4 and L5.  Rods were then secured into the tulip heads of the screws bilaterally.  Caps were then placed and a final locking procedure was performed.  I was very pleased with the final AP and lateral fluoroscopic images.  The wound was then copiously irrigated.  On the right and left sides, the fascia was closed using #1 Vicryl.  The subcutaneous layer was closed using 0 Vicryl followed by 2-0 Vicryl, and the skin was then closed using 4-0 Monocryl. Benzoin and Steri-Strips were applied followed by sterile dressing.  All instrument counts were correct at the termination of the procedure.   Of note, Pricilla Holm was my assistant throughout surgery, and did aid in retraction, suctioning, placement of the hardware, and closure for both the anterior and posterior portions of the procedure.     Phylliss Bob, MD

## 2020-06-10 NOTE — Transfer of Care (Signed)
Immediate Anesthesia Transfer of Care Note  Patient: Katrina Vasquez  Procedure(s) Performed: LUMBAR 4 - LUMBAR 5, LUMBAR 5 - SACRUM 1 POSTERIOR SPINAL FUSION WITH INSTRUMENTATION AND ALLOGRAFT (N/A )  Patient Location: PACU  Anesthesia Type:General  Level of Consciousness: oriented, drowsy, patient cooperative and responds to stimulation  Airway & Oxygen Therapy: Patient Spontanous Breathing and Patient connected to nasal cannula oxygen  Post-op Assessment: Report given to RN, Post -op Vital signs reviewed and stable and Patient moving all extremities X 4  Post vital signs: Reviewed and stable  Last Vitals:  Vitals Value Taken Time  BP 115/67 06/10/20 0948  Temp    Pulse 83 06/10/20 0952  Resp 30 06/10/20 0952  SpO2 100 % 06/10/20 0952  Vitals shown include unvalidated device data.  Last Pain:  Vitals:   06/10/20 0449  TempSrc:   PainSc: 4       Patients Stated Pain Goal: 3 (58/83/25 4982)  Complications: No complications documented.

## 2020-06-10 NOTE — Anesthesia Postprocedure Evaluation (Signed)
Anesthesia Post Note  Patient: Katrina Vasquez  Procedure(s) Performed: LUMBAR FOUR - LUMBAR FIVE , LUMBAR FIVE - SACRUM ONE  ANTERIOR LUMBAR INTERBODY FUSION WITH INSTRUMENTATION AND ALLOGRAFT (N/A Spine Lumbar) ABDOMINAL EXPOSURE (N/A Abdomen)     Patient location during evaluation: PACU Anesthesia Type: General Level of consciousness: awake and alert Pain management: pain level controlled Vital Signs Assessment: post-procedure vital signs reviewed and stable Respiratory status: spontaneous breathing, nonlabored ventilation, respiratory function stable and patient connected to nasal cannula oxygen Cardiovascular status: blood pressure returned to baseline and stable Postop Assessment: no apparent nausea or vomiting Anesthetic complications: no   No complications documented.  Last Vitals:  Vitals:   06/10/20 1144 06/10/20 1600  BP: 124/78 (!) 104/53  Pulse: 84 88  Resp: 16 16  Temp: 36.5 C 37.1 C  SpO2: 97% 98%    Last Pain:  Vitals:   06/10/20 1623  TempSrc:   PainSc: 4     LLE Motor Response: Purposeful movement (06/10/20 1935) LLE Sensation: Full sensation (06/10/20 1935) RLE Motor Response: Purposeful movement (06/10/20 1935) RLE Sensation: Full sensation (06/10/20 1935)      Jazzmine Kleiman

## 2020-06-10 NOTE — Progress Notes (Signed)
Patient ID: Katrina Vasquez, female   DOB: 12-26-1960, 60 y.o.   MRN: [id number]  Progress Note    06/10/2020 7:32 AM Day of Surgery  Subjective: Comfortable this morning.  In short stay awaiting return to the operating room for posterior fusion.  Reports that she was able to walk last evening without difficulty.  Had some mild nausea last night and none this morning.  Has passed flatus   Vitals:   06-21-20 2354 06/10/20 0417  BP: 98/74 106/67  Pulse: 94 98  Resp: 18 18  Temp: 98.5 F (36.9 C) 98 F (36.7 C)  SpO2: 98% 96%   Physical Exam: Abdomen soft with mild tenderness. 2+ dorsalis pedis pulses bilaterally  CBC    Component Value Date/Time   WBC 7.3 06/02/2020 1200   RBC 4.62 06/02/2020 1200   HGB 12.9 06/02/2020 1200   HCT 40.9 06/02/2020 1200   PLT 274 06/02/2020 1200   MCV 88.5 06/02/2020 1200   MCH 27.9 06/02/2020 1200   MCHC 31.5 06/02/2020 1200   RDW 14.1 06/02/2020 1200   LYMPHSABS 3.1 06/02/2020 1200   MONOABS 0.6 06/02/2020 1200   EOSABS 0.2 06/02/2020 1200   BASOSABS 0.0 06/02/2020 1200    BMET    Component Value Date/Time   NA 135 06/02/2020 1200   K 3.4 (L) 06/02/2020 1200   CL 103 06/02/2020 1200   CO2 23 06/02/2020 1200   GLUCOSE 109 (H) 06/02/2020 1200   BUN 18 06/02/2020 1200   CREATININE 1.10 (H) 06/02/2020 1200   CALCIUM 10.3 06/02/2020 1200   GFRNONAA 58 (L) 06/02/2020 1200   GFRAA >60 10/23/2017 2143    INR    Component Value Date/Time   INR 1.0 06/02/2020 1200     Intake/Output Summary (Last 24 hours) at 06/10/2020 0732 Last data filed at 06/10/2020 0430 Gross per 24 hour  Intake 2650 ml  Output 2000 ml  Net 650 ml     Assessment/Plan:  60 y.o. female doing well status post anterior exposure for L4-5 and L5-S1 interbody fusion.  For return to the operating room this morning as planned for posterior fixation with Dr. Lynann Bologna.  Will not follow actively.  Please call if we can assist     Rosetta Posner, MD [facility] [phone] 06/10/2020 7:32 AM

## 2020-06-10 NOTE — H&P (Addendum)
Patient tolerated stage 1 of her procedure yesterday well. Will proceed with stage 2 today, a PSF with instrumentation and allograft, L4-S1.

## 2020-06-10 NOTE — Anesthesia Procedure Notes (Signed)
Procedure Name: Intubation Date/Time: 06/10/2020 7:50 AM Performed by: Claris Che, CRNA Pre-anesthesia Checklist: Patient identified, Emergency Drugs available, Suction available, Patient being monitored and Timeout performed Patient Re-evaluated:Patient Re-evaluated prior to induction Oxygen Delivery Method: Circle system utilized Preoxygenation: Pre-oxygenation with 100% oxygen Induction Type: IV induction and Cricoid Pressure applied Ventilation: Mask ventilation without difficulty Laryngoscope Size: Mac and 4 Grade View: Grade II Tube type: Oral Tube size: 7.5 mm Airway Equipment and Method: Stylet Placement Confirmation: ETT inserted through vocal cords under direct vision,  positive ETCO2 and breath sounds checked- equal and bilateral Secured at: 22 cm Tube secured with: Tape Dental Injury: Teeth and Oropharynx as per pre-operative assessment

## 2020-06-10 NOTE — Anesthesia Postprocedure Evaluation (Signed)
Anesthesia Post Note  Patient: Katrina Vasquez  Procedure(s) Performed: LUMBAR 4 - LUMBAR 5, LUMBAR 5 - SACRUM 1 POSTERIOR SPINAL FUSION WITH INSTRUMENTATION AND ALLOGRAFT (N/A )     Patient location during evaluation: PACU Anesthesia Type: General Level of consciousness: awake and alert and oriented Pain management: pain level controlled Vital Signs Assessment: post-procedure vital signs reviewed and stable Respiratory status: spontaneous breathing, nonlabored ventilation and respiratory function stable Cardiovascular status: blood pressure returned to baseline Postop Assessment: no apparent nausea or vomiting Anesthetic complications: no   No complications documented.  Last Vitals:  Vitals:   06/10/20 1130 06/10/20 1144  BP: 131/83 124/78  Pulse: 79 84  Resp: 13 16  Temp:  36.5 C  SpO2: 98% 97%    Last Pain:  Vitals:   06/10/20 1144  TempSrc: Oral  PainSc:                  Brennan Bailey

## 2020-06-11 MED ORDER — HYDROMORPHONE HCL 2 MG PO TABS: 1.0000 mg | ORAL_TABLET | ORAL | 0 refills | Status: DC | PRN

## 2020-06-11 MED FILL — Sodium Chloride IV Soln 0.9%: INTRAVENOUS | Qty: 1000 | Status: AC

## 2020-06-11 MED FILL — Heparin Sodium (Porcine) Inj 1000 Unit/ML: INTRAMUSCULAR | Qty: 30 | Status: AC

## 2020-06-11 MED FILL — Thrombin (Recombinant) For Soln 20000 Unit: CUTANEOUS | Qty: 1 | Status: AC

## 2020-06-11 NOTE — Progress Notes (Signed)
    Patient doing well Patient reports moderate back pain Denies leg pain   Physical Exam: Vitals:   06/10/20 2245 06/11/20 0347  BP: (!) 99/58 111/83  Pulse: 85 83  Resp: 16 18  Temp: 98.8 F (37.1 C) 98.2 F (36.8 C)  SpO2: 97% 95%    Dressing in place NVI  POD s/p L4-S1 A/P fusion, doing well  - up with PT/OT, encourage ambulation - Dilaudid for pain, Robaxin for muscle spasms - d/c home today with f/u in 2 weeks

## 2020-06-11 NOTE — Progress Notes (Signed)
Spoke with RN who was made aware that she can removed the midline.

## 2020-06-11 NOTE — TOC Initial Note (Signed)
Transition of Care [facility]) - Initial/Assessment Note    Patient Details  Name: Katrina Vasquez MRN: [id number] Date of Birth: 1960/11/30  Transition of Care [facility]) CM/SW Contact:    Joanne Chars, LCSW Phone Number: 06/11/2020, 3:28 PM  Clinical Narrative:    CSW met with pt regarding recommendation for [facility].  Pt agreeable, choice document given, no agency preference indicated.  Permission given to speak with husband.  HH arranged through [facility], which is now [place].               Expected Discharge Plan: [place] Barriers to Discharge: No Barriers Identified   Patient Goals and CMS Choice Patient states their goals for this hospitalization and ongoing recovery are:: be pain free CMS Medicare.gov Compare Post Acute Care list provided to:: Patient Choice offered to / list presented to : Patient  Expected Discharge Plan and Services Expected Discharge Plan: [place] In-house Referral: Clinical Social Work   Post Acute Care Choice: [place] arrangements for the past 2 months: [place] Expected Discharge Date: 06/11/20               DME Arranged: N/A (DME through [facility] staff)         [place] Arranged: PT,OT [place]: [place] Date [place]: 06/11/20 Time [place]: 1515 Representative spoke with at [place]: Amy  Prior Living Arrangements/Services Living arrangements for the past 2 months: [place] with:: Spouse Patient language and need for interpreter reviewed:: Yes Do you feel safe going back to the place where you live?: Yes      Need for Family Participation in Patient Care: Yes (Comment) Care giver support system in place?: Yes (comment) Current home services: Other (comment) (none) Criminal Activity/Legal Involvement Pertinent to Current Situation/Hospitalization: No - Comment as needed  Activities of Daily Living      Permission Sought/Granted Permission  sought to share information with : Family Supports Permission granted to share information with : Yes, Verbal Permission Granted  Share Information with NAME: husband ( name not on face sheet, did not ask for it)  Permission granted to share info w AGENCY: HH        Emotional Assessment Appearance:: Appears stated age Attitude/Demeanor/Rapport: Engaged Affect (typically observed): Appropriate,Pleasant Orientation: : Oriented to Self,Oriented to Place,Oriented to  Time,Oriented to Situation Alcohol / Substance Use: Not Applicable Psych Involvement: No (comment)  Admission diagnosis:  Radiculopathy [M54.10] Patient Active Problem List   Diagnosis Date Noted  . Radiculopathy 06/09/2020  . 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 S/P hysterectomy 10/01/2014   PCP:  Nolene Ebbs, MD Pharmacy:   [place], [place] - [address] [place] [address] Phone: [phone] Fax: [phone]  [place], [place] - [address] [place] [address] Phone: [phone] Fax: [phone]     Social Determinants of Health (SDOH) Interventions    Readmission Risk Interventions No flowsheet data found.

## 2020-06-11 NOTE — Evaluation (Signed)
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  Admitted 5/18 s/p L 4-5 L5-S1 ALIF 5/18 and PLIF L4-5, L5-S1 5/19.  PMH includes: arthritis, HTN, PNA, cervical CA (in remission), prior back surgery.   Clinical Impression   PTA patient independent with ADLs, using cane for mobility.  Admitted for above and limited by problem list below, including back precaution adherence, pain, impaired balance, decreased activity tolerance, and cognition.  She is able to follow commands with increased time but poor problem solving and decreased recall of precautions.  Patient currently requires up to min assist for ADLs, min assist to min guard for transfers and mobility using RW. She reports she will have support of spouse for 1 week for assist as needed.  Based on performance today, recommend continued OT services while admitted and after dc at [facility] level to optimize independence, safety and return to PLOF.      Follow Up Recommendations  Home health OT;Supervision/Assistance - 24 hour    Equipment Recommendations  3 in 1 bedside commode;Other (comment) (RW)    Recommendations for Other Services PT consult     Precautions / Restrictions Precautions Precautions: Back Precaution Booklet Issued: Yes (comment) Precaution Comments: reviewed precautions with pt, requires cueing to adhere functionally Required Braces or Orthoses: Spinal Brace Spinal Brace: Thoracolumbosacral orthotic;Applied in sitting position Restrictions Weight Bearing Restrictions: No      Mobility Bed Mobility Overal bed mobility: Needs Assistance Bed Mobility: Rolling;Sidelying to Sit;Sit to Sidelying Rolling: Supervision Sidelying to sit: Supervision     Sit to sidelying: Supervision General bed  mobility comments: for safety and log rolling technique    Transfers Overall transfer level: Needs assistance Equipment used: Rolling walker (2 wheeled) Transfers: Sit to/from Stand Sit to Stand: Min assist;Min guard         General transfer comment: min assist initally fading to min guard, with cueing for hand placement, safety and posture    Balance Overall balance assessment: Needs assistance Sitting-balance support: No upper extremity supported;Feet supported Sitting balance-Leahy Scale: Fair     Standing balance support: During functional activity;Bilateral upper extremity supported;Single extremity supported Standing balance-Leahy Scale: Fair Standing balance comment: relies on at least 1 UE support during ADLs                           ADL either performed or assessed with clinical judgement   ADL Overall ADL's : Needs assistance/impaired     Grooming: Min guard;Standing   Upper Body Bathing: Set up;Sitting   Lower Body Bathing: Minimal assistance;Sit to/from stand   Upper Body Dressing : Minimal assistance;Sitting Upper Body Dressing Details (indicate cue type and reason): for brace mgmt Lower Body Dressing: Minimal assistance;Sit to/from stand Lower Body Dressing Details (indicate cue type and reason): assist with socks initally, able to figure 4 with increased time during session and dons 2nd pair of socks without assist; min guard sit to stand Toilet Transfer: Min guard;Ambulation;RW           Functional mobility during ADLs: Min guard;Rolling walker;Cueing for safety;Cueing for sequencing General ADL Comments: pt limited by back precautions, pain, cognition and activity tolerance     Vision         Perception  Praxis      Pertinent Vitals/Pain Pain Assessment: Faces Faces Pain Scale: Hurts little more Pain Location: incisional Pain Descriptors / Indicators: Discomfort;Operative site guarding Pain Intervention(s): Limited  activity within patient's tolerance;Monitored during session;Repositioned     Hand Dominance     Extremity/Trunk Assessment Upper Extremity Assessment Upper Extremity Assessment: Generalized weakness   Lower Extremity Assessment Lower Extremity Assessment: Defer to PT evaluation   Cervical / Trunk Assessment Cervical / Trunk Assessment: Other exceptions Cervical / Trunk Exceptions: s/p back surgery   Communication Communication Communication: No difficulties   Cognition Arousal/Alertness: Awake/alert Behavior During Therapy: Flat affect Overall Cognitive Status: No family/caregiver present to determine baseline cognitive functioning Area of Impairment: Memory;Following commands;Awareness;Problem solving                     Memory: Decreased short-term memory;Decreased recall of precautions Following Commands: Follows one step commands consistently;Follows one step commands with increased time;Follows multi-step commands inconsistently   Awareness: Emergent Problem Solving: Slow processing;Requires verbal cues;Difficulty sequencing General Comments: pt requires repeated cues for safety and back precautions, she has slow processing and difficutly sequencing. pt very inconsintent with home setup and DME, initally reporting having [place] and RW but then reports not having them   General Comments       Exercises     Shoulder Instructions      Home Living Family/patient expects to be discharged to:: Private residence Living Arrangements: Spouse/significant other Available Help at Discharge: Family;Available 24 hours/day (for 1 week) Type of Home: House Home Access: Stairs to enter [employer] of Steps: 2 Entrance Stairs-Rails: None Home Layout: One level     Bathroom Shower/Tub: Teacher, early years/pre: Standard     Home Equipment: Cane - single point          Prior Functioning/Environment Level of Independence: Independent with  assistive device(s)        Comments: on disability, limited iADLs; using cane for mobility        OT Problem List: Decreased strength;Decreased activity tolerance;Impaired balance (sitting and/or standing);Decreased cognition;Decreased safety awareness;Decreased knowledge of use of DME or AE;Decreased knowledge of precautions;Pain      OT Treatment/Interventions: Self-care/ADL training;Therapeutic exercise;DME and/or AE instruction;Therapeutic activities;Cognitive remediation/compensation;Patient/family education;Balance training    OT Goals(Current goals can be found in the care plan section) Acute Rehab OT Goals Patient Stated Goal: home OT Goal Formulation: With patient Time For Goal Achievement: 06/25/20 Potential to Achieve Goals: Good  OT Frequency: Min 2X/week   Barriers to D/C:            Co-evaluation              AM-PAC OT "6 Clicks" Daily Activity     Outcome Measure Help from another person eating meals?: None Help from another person taking care of personal grooming?: A Little Help from another person toileting, which includes using toliet, bedpan, or urinal?: A Little Help from another person bathing (including washing, rinsing, drying)?: A Little Help from another person to put on and taking off regular upper body clothing?: A Little Help from another person to put on and taking off regular lower body clothing?: A Little 6 Click Score: 19   End of Session Equipment Utilized During Treatment: Rolling walker;Back brace Nurse Communication: Mobility status  Activity Tolerance: Patient tolerated treatment well Patient left: in bed;with call bell/phone within reach  OT Visit Diagnosis: Other abnormalities of gait and mobility (R26.89);Muscle weakness (generalized) (M62.81);Pain Pain - part of body:  (  back)                Time: 3762-8315 OT Time Calculation (min): 25 min Charges:  OT General Charges $OT Visit: 1 Visit OT Evaluation $OT Eval Moderate  Complexity: 1 Mod OT Treatments $Self Care/Home Management : 8-22 mins  Jolaine Artist, OT Acute Rehabilitation Services Pager [phone] Office [phone]   Delight Stare 06/11/2020, 10:26 AM

## 2020-06-11 NOTE — Evaluation (Addendum)
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  Admitted 5/18 s/p L 4-5 L5-S1 ALIF 5/18 and PLIF L4-5, L5-S1 5/19.  PMH includes: arthritis, HTN, PNA, cervical CA (in remission), prior back surgery.    Clinical Impression  Pt admitted with above diagnosis. At the time of PT eval, pt was able to demonstrate transfers and ambulation with gross min guard assist to min assist with RW for support. Pt required cues for eyes open throughout the session and was somewhat lethargic, requiring increased cues and repetition of instruction. Pt was educated on precautions, brace application/wearing schedule, appropriate activity progression, and car transfer. Pt currently with functional limitations due to the deficits listed below (see PT Problem List). Pt will benefit from skilled PT to increase their independence and safety with mobility to allow discharge to the venue listed below.      Follow Up Recommendations Home health PT    Equipment Recommendations  Rolling walker with 5" wheels (if pt does not already have one - she was not clear with DME when asked)    Recommendations for Other Services       Precautions / Restrictions Precautions Precautions: Back Precaution Booklet Issued: Yes (comment) Precaution Comments: reviewed precautions with pt, requires cueing to adhere functionally Required Braces or Orthoses: Spinal Brace Spinal Brace: Thoracolumbosacral orthotic;Applied in sitting position Restrictions Weight Bearing Restrictions: No      Mobility  Bed Mobility Overal bed mobility: Needs Assistance Bed Mobility: Rolling;Sidelying to Sit;Sit to Sidelying Rolling: Supervision Sidelying to sit: Min guard     Sit to sidelying: Min guard General bed mobility  comments: for safety and log rolling technique    Transfers Overall transfer level: Needs assistance Equipment used: Rolling walker (2 wheeled) Transfers: Sit to/from Stand Sit to Stand: Min assist;Min guard         General transfer comment: Min assist for balance support and safety progressing to min guard, however upon return to the room and pt fatigued, again required min assist for stand>sit.  Ambulation/Gait Ambulation/Gait assistance: Min guard;Min assist Gait Distance (Feet): 250 Feet Assistive device: Rolling walker (2 wheeled) Gait Pattern/deviations: Step-through pattern;Decreased stride length;Trunk flexed Gait velocity: Decreased Gait velocity interpretation: <1.31 ft/sec, indicative of household ambulator General Gait Details: VC's for improved posture, closer walker proximity, and forward gaze.  Stairs  Pt was instructed in stair negotiation. She was able to complete 1 step (forwards ascending and backwards descending), x3 reps. She then was able to progress to 2 stairs, turning around, and descending 2 stairs back to ground level. Min guard to min assist required throughout stair training.           Wheelchair Mobility    Modified Rankin (Stroke Patients Only)       Balance Overall balance assessment: Needs assistance Sitting-balance support: No upper extremity supported;Feet supported Sitting balance-Leahy Scale: Fair     Standing balance support: During functional activity;Bilateral upper extremity supported;Single extremity supported Standing balance-Leahy Scale: Fair Standing balance comment: relies on at least 1 UE support during ADLs                             Pertinent Vitals/Pain Pain Assessment: Faces Faces Pain Scale: Hurts little more Pain Location: incisional Pain  Descriptors / Indicators: Discomfort;Operative site guarding Pain Intervention(s): Limited activity within patient's tolerance;Monitored during  session;Repositioned    Home Living Family/patient expects to be discharged to:: Private residence Living Arrangements: Spouse/significant other Available Help at Discharge: Family;Available 24 hours/day (for 1 week) Type of Home: House Home Access: Stairs to enter Entrance Stairs-Rails: None Entrance Stairs-Number of Steps: 2 Home Layout: One level Home Equipment: Cane - single point      Prior Function Level of Independence: Independent with assistive device(s)         Comments: on disability, limited iADLs; using cane for mobility     Hand Dominance        Extremity/Trunk Assessment   Upper Extremity Assessment Upper Extremity Assessment: Defer to OT evaluation    Lower Extremity Assessment Lower Extremity Assessment: Generalized weakness    Cervical / Trunk Assessment Cervical / Trunk Assessment: Other exceptions Cervical / Trunk Exceptions: s/p back surgery  Communication   Communication: No difficulties  Cognition Arousal/Alertness: Lethargic;Suspect due to medications Behavior During Therapy: Flat affect Overall Cognitive Status: No family/caregiver present to determine baseline cognitive functioning Area of Impairment: Memory;Following commands;Awareness;Problem solving                     Memory: Decreased short-term memory;Decreased recall of precautions Following Commands: Follows one step commands consistently;Follows one step commands with increased time;Follows multi-step commands inconsistently   Awareness: Emergent Problem Solving: Slow processing;Requires verbal cues;Difficulty sequencing General Comments: pt requires repeated cues for safety and back precautions, she has slow processing and difficulty sequencing. pt very inconsintent with home setup and DME, initally reporting having [place] and RW but then reports not having them      General Comments      Exercises     Assessment/Plan    PT Assessment Patient needs continued PT  services  PT Problem List Decreased strength;Decreased activity tolerance;Decreased balance;Decreased mobility;Decreased knowledge of use of DME;Decreased knowledge of precautions;Decreased safety awareness;Pain       PT Treatment Interventions DME instruction;Gait training;Stair training;Functional mobility training;Therapeutic activities;Therapeutic exercise;Neuromuscular re-education;Patient/family education    PT Goals (Current goals can be found in the Care Plan section)  Acute Rehab PT Goals Patient Stated Goal: home PT Goal Formulation: With patient Time For Goal Achievement: 06/18/20 Potential to Achieve Goals: Good    Frequency Min 5X/week   Barriers to discharge        Co-evaluation               AM-PAC PT "6 Clicks" Mobility  Outcome Measure Help needed turning from your back to your side while in a flat bed without using bedrails?: None Help needed moving from lying on your back to sitting on the side of a flat bed without using bedrails?: A Little Help needed moving to and from a bed to a chair (including a wheelchair)?: A Little Help needed standing up from a chair using your arms (e.g., wheelchair or bedside chair)?: A Little Help needed to walk in hospital room?: A Little Help needed climbing 3-5 steps with a railing? : A Little 6 Click Score: 19    End of Session Equipment Utilized During Treatment: Gait belt;Back brace Activity Tolerance: Patient limited by lethargy Patient left: in bed;with call bell/phone within reach Nurse Communication: Mobility status PT Visit Diagnosis: Unsteadiness on feet (R26.81);Pain Pain - part of body:  (back)    Time: 1031-1100 PT Time Calculation (min) (ACUTE ONLY): 29 min   Charges:   PT Evaluation $PT Eval Low  Complexity: 1 Low PT Treatments $Gait Training: 8-22 mins        Rolinda Roan, PT, DPT Acute Rehabilitation Services Pager: [phone] Office: [place] 06/11/2020, 1:59  PM

## 2020-06-11 NOTE — Progress Notes (Signed)
Patient is discharged from room 3C02 at this time. Alert and in stable condition. IV site d/c'd and instructions read to patient with understanding verbalized and all questions answered. Left unit via wheelchair with all belongings at side.  

## 2020-06-11 NOTE — Plan of Care (Signed)
Patient alert and oriented, voiding adequately, skin clean, dry and intact without evidence of skin break down, or symptoms of complications - no redness or edema noted, only slight tenderness at site.  Patient states pain is manageable at time of discharge. Patient has an appointment with MD June 1st.

## 2020-06-12 ENCOUNTER — Other Ambulatory Visit: Payer: Self-pay | Admitting: Cardiology

## 2020-06-12 DIAGNOSIS — I10 Essential (primary) hypertension: Secondary | ICD-10-CM

## 2020-06-12 DIAGNOSIS — R000 Tachycardia, unspecified: Secondary | ICD-10-CM

## 2020-06-18 NOTE — Discharge Summary (Signed)
Patient ID: Katrina Vasquez MRN: [id number] DOB/AGE: 59-16-1962 59 y.o.  Admit date: 06/09/2020 Discharge date: 06/11/2020  Admission Diagnoses:  Active Problems:   Radiculopathy   Discharge Diagnoses:  Same  Past Medical History:  Diagnosis Date  . Arthritis    back  . Back pain   . Cancer ([place])    cervical in remission  . Carpal tunnel syndrome   . Chronic pain   . Dyspnea   . GERD (gastroesophageal reflux disease)   . Headache   . History of kidney stones   . Hypercholesteremia   . Hypertension   . Leg pain    Bilateral  . Pneumonia   . 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 female who was admitted 06/09/2020 for operative treatment of radiculopathy. Patient has severe unremitting pain that affects sleep, daily activities, and work/hobbies. After pre-op clearance the patient was taken to the operating room on 06/10/2020 and underwent  Procedure(s): LUMBAR 4 - LUMBAR 5, LUMBAR 5 - SACRUM 1 POSTERIOR SPINAL FUSION WITH INSTRUMENTATION AND ALLOGRAFT.    Patient was given perioperative antibiotics:  Anti-infectives (From admission, onward)   Start     Dose/Rate Route Frequency Ordered Stop   06/10/20 0718  ceFAZolin (ANCEF) 2-4 GM/100ML-% IVPB       Note to Pharmacy: Valda Lamb   : cabinet override      06/10/20 0718 06/10/20 1929   06/09/20 1700  ceFAZolin (ANCEF) IVPB 2g/100 mL premix        2 g 200 mL/hr over 30 Minutes Intravenous Every 8 hours 06/09/20 1520 06/10/20 0051   06/09/20 1400  ceFAZolin (ANCEF) IVPB 2g/100 mL premix  Status:  Discontinued        2 g 200 mL/hr over 30 Minutes Intravenous On call to O.R. 06/09/20 1347 06/09/20 1533   06/09/20 0645  ceFAZolin (ANCEF) IVPB 2g/100 mL premix        2  g 200 mL/hr over 30 Minutes Intravenous On call to O.R. 06/09/20 3810 06/09/20 0905       Patient was given sequential compression devices, early ambulation to prevent DVT.  Patient benefited maximally from hospital stay and there were no complications.    Recent vital signs: BP 104/73 (BP Location: Right Arm)   Pulse (!) 59   Temp (!) 97.5 F (36.4 C) (Oral)   Resp 16   Ht 5\' 3"  (1.6 m)   Wt 63 kg   SpO2 99%   BMI 24.62 kg/m   Discharge Medications:   Allergies as of 06/11/2020      Reactions   Percocet [oxycodone-acetaminophen] Nausea And Vomiting   Hydroxyzine Hcl Itching   Tramadol Nausea And Vomiting      Medication List    TAKE these medications   acetaminophen 500 MG tablet Commonly known as: TYLENOL Take 500-1,000 mg by mouth every 6 (six) hours as needed for moderate pain or headache.   amitriptyline 25 MG tablet Commonly known as: ELAVIL Take 75 mg by mouth at bedtime.   baclofen 10 MG tablet Commonly known as: LIORESAL Take 10 mg by mouth 3 (three) times daily.   HYDROcodone-acetaminophen 7.5-325 MG tablet Commonly known as: NORCO Take 1 tablet by mouth 4 (four) times daily.  HYDROmorphone 2 MG tablet Commonly known as: DILAUDID Take 0.5-1 tablets (1-2 mg total) by mouth every 4 (four) hours as needed for severe pain.   Hysingla ER 60 MG T24a Generic drug: HYDROcodone Bitartrate ER Take 60 mg by mouth daily.   lisinopril 10 MG tablet Commonly known as: ZESTRIL Take 1 tablet (10 mg total) by mouth daily.   lubiprostone 24 MCG capsule Commonly known as: AMITIZA Take 24 mcg by mouth 2 (two) times daily.   promethazine 25 MG tablet Commonly known as: PHENERGAN Take 25 mg by mouth in the morning, at noon, and at bedtime.   rosuvastatin 10 MG tablet Commonly known as: CRESTOR Take 10 mg by mouth daily.   sucralfate 1 g tablet Commonly known as: CARAFATE Take 1 g by mouth 2 (two) times daily.   Ventolin HFA 108 (90 Base) MCG/ACT  inhaler Generic drug: albuterol Inhale 2 puffs into the lungs 4 (four) times daily as needed for shortness of breath.       Diagnostic Studies: DG Lumbar Spine 2-3 Views  Result Date: 06/10/2020 CLINICAL DATA:  Surgery, elective. Additional history provided: Lumbar 4-lumbar 5, lumbar 5-sacral 1 posterior spinal fusion with instrumentation and allograft. Provided fluoroscopy time 1:50 1.5 seconds (22.76 mGy). EXAM: LUMBAR SPINE - 2-3 VIEW; DG C-ARM 1-60 MIN COMPARISON:  Intraoperative fluoroscopic images of the lumbar spine 06/09/2020. CT of the lumbar spine 02/02/2020. FINDINGS: AP and lateral view intraoperative fluoroscopic images of the lumbosacral spine are submitted, 2 images total. The lowest well-formed intervertebral disc space is designated L5-S1. Redemonstrated anterior approach interbody fusion hardware at L4-L5 and L5-S1. New from the prior intraoperative fluoroscopic images of 06/09/2020, a posterior spinal fusion construct spans the L4-S1 levels (bilateral pedicle screws and vertical interconnecting rods). IMPRESSION: Two intraoperative fluoroscopic images of the lumbosacral spine, as described. New from prior exams, a posterior spinal fusion construct now spans the L4-S1 levels. Electronically Signed   By: Kellie Simmering DO   On: 06/10/2020 14:00   DG Lumbar Spine 2-3 Views  Result Date: 06/09/2020 CLINICAL DATA:  Surgery, elective. Additional history provided: L4-S1 ALIF. Provided fluoroscopy time 52 seconds (26.65 mGy). EXAM: LUMBAR SPINE - 2-3 VIEW; DG C-ARM 1-60 MIN COMPARISON:  Lumbar spine MRI 10/25/2019. Lumbar spine radiographs 02/14/2017. FINDINGS: AP and lateral view intraoperative fluoroscopic images of the lumbosacral spine are submitted, 2 images total. The lowest well-formed intervertebral disc space is designated L5-S1. On the provided images, anterior approach interbody fusion hardware is present at the L4-L5 and L5-S1 levels. Overlying retractors. IMPRESSION: Two  intraoperative fluoroscopic images of the lumbosacral spine from reported L4-L5 and L5-S1 ALIF, as described. Electronically Signed   By: Kellie Simmering DO   On: 06/09/2020 12:43   DG C-Arm 1-60 Min  Result Date: 06/10/2020 CLINICAL DATA:  Surgery, elective. Additional history provided: Lumbar 4-lumbar 5, lumbar 5-sacral 1 posterior spinal fusion with instrumentation and allograft. Provided fluoroscopy time 1:50 1.5 seconds (22.76 mGy). EXAM: LUMBAR SPINE - 2-3 VIEW; DG C-ARM 1-60 MIN COMPARISON:  Intraoperative fluoroscopic images of the lumbar spine 06/09/2020. CT of the lumbar spine 02/02/2020. FINDINGS: AP and lateral view intraoperative fluoroscopic images of the lumbosacral spine are submitted, 2 images total. The lowest well-formed intervertebral disc space is designated L5-S1. Redemonstrated anterior approach interbody fusion hardware at L4-L5 and L5-S1. New from the prior intraoperative fluoroscopic images of 06/09/2020, a posterior spinal fusion construct spans the L4-S1 levels (bilateral pedicle screws and vertical interconnecting rods). IMPRESSION: Two intraoperative fluoroscopic images of the lumbosacral spine,  as described. New from prior exams, a posterior spinal fusion construct now spans the L4-S1 levels. Electronically Signed   By: Kellie Simmering DO   On: 06/10/2020 14:00   DG C-Arm 1-60 Min  Result Date: 06/09/2020 CLINICAL DATA:  Surgery, elective. Additional history provided: L4-S1 ALIF. Provided fluoroscopy time 52 seconds (26.65 mGy). EXAM: LUMBAR SPINE - 2-3 VIEW; DG C-ARM 1-60 MIN COMPARISON:  Lumbar spine MRI 10/25/2019. Lumbar spine radiographs 02/14/2017. FINDINGS: AP and lateral view intraoperative fluoroscopic images of the lumbosacral spine are submitted, 2 images total. The lowest well-formed intervertebral disc space is designated L5-S1. On the provided images, anterior approach interbody fusion hardware is present at the L4-L5 and L5-S1 levels. Overlying retractors. IMPRESSION:  Two intraoperative fluoroscopic images of the lumbosacral spine from reported L4-L5 and L5-S1 ALIF, as described. Electronically Signed   By: Kellie Simmering DO   On: 06/09/2020 12:43   PCV ECHOCARDIOGRAM COMPLETE  Result Date: 06/08/2020 Echocardiogram 06/08/2020: Normal LV systolic function with EF 63%. Left ventricle cavity is normal in size. Normal left ventricular wall thickness. Normal global wall motion. Abnormal septal wall motion due to left bundle branch block. Doppler evidence of grade I (impaired) diastolic dysfunction, normal LAP. Calculated EF 63%. Structurally normal tricuspid valve with trace regurgitation. No evidence of tricuspid valve stenosis. No evidence of pulmonary hypertension.  DG OR LOCAL ABDOMEN  Result Date: 06/09/2020 CLINICAL DATA:  Spinal fusion. Assessment for potential foreign body EXAM: OR LOCAL ABDOMEN COMPARISON:  Jun 21, 2016 FINDINGS: Postoperative support hardware at L4-5 and L5-S1. Surgical clips throughout the abdomen, unchanged. No retained instrument or needle appreciable. Bowel gas pattern normal. IMPRESSION: Postoperative support hardware and clips. No needle or retained instrument. Bowel gas pattern normal. Critical Value/emergent results were called by telephone at the time of interpretation on 06/09/2020 at 1:04 pm to provider Shawn Route, RN, who verbally acknowledged these results. Electronically Signed   By: Lowella Grip III M.D.   On: 06/09/2020 13:04    Disposition: Discharge disposition: 01-Home or Self Care        POD s/p L4-S1 A/P fusion, doing well  - up with PT/OT, encourage ambulation - Dilaudid for pain, Robaxin for muscle spasms -Scripts for pain sent to pharmacy electronically  -D/C instructions sheet printed and in chart -D/C today  -F/U in office 2 weeks   Signed: Lennie Muckle Collyn Selk 06/18/2020, 9:07 AM

## 2020-06-28 ENCOUNTER — Other Ambulatory Visit: Payer: Medicare Other

## 2020-07-21 ENCOUNTER — Ambulatory Visit: Payer: Medicare Other | Admitting: Cardiology

## 2020-07-23 ENCOUNTER — Ambulatory Visit: Payer: Medicare Other | Admitting: Cardiology

## 2020-09-03 ENCOUNTER — Other Ambulatory Visit: Payer: Self-pay | Admitting: *Deleted

## 2020-09-03 DIAGNOSIS — I83893 Varicose veins of bilateral lower extremities with other complications: Secondary | ICD-10-CM

## 2020-09-17 ENCOUNTER — Other Ambulatory Visit: Payer: Self-pay

## 2020-09-17 ENCOUNTER — Ambulatory Visit (HOSPITAL_COMMUNITY)
Admission: RE | Admit: 2020-09-17 | Discharge: 2020-09-17 | Disposition: A | Discharge status: Home / Self Care | Payer: Medicare Other | Source: Ambulatory Visit | Attending: Vascular Surgery | Admitting: Vascular Surgery

## 2020-09-17 ENCOUNTER — Ambulatory Visit: Payer: Medicare Other | Admitting: Physician Assistant

## 2020-09-17 VITALS — BP 141/93 | HR 84 | Temp 97.5°F | Resp 20 | Ht 63.0 in | Wt 135.7 lb

## 2020-09-17 DIAGNOSIS — I83893 Varicose veins of bilateral lower extremities with other complications: Secondary | ICD-10-CM | POA: Diagnosis present

## 2020-09-17 DIAGNOSIS — M5137 Other intervertebral disc degeneration, lumbosacral region: Secondary | ICD-10-CM

## 2020-09-17 NOTE — Progress Notes (Signed)
[facility] [place]   Reason for referral: medial thigh pain at rest  History of Present Illness  Katrina Vasquez is a 60 y.o. female who presents with chief complaint: swollen leg.  Patient notes, onset of ache type pain medial upper/mid thigh for years.  She recently under went ant/post spinal surgery with over all good results.  The patient has had no history of DVT, no history of varicose vein, no history of venous stasis ulcers, no history of  Lymphedema and no history of skin changes in lower legs.  There is no family history of venous disorders.  The patient has not used compression stockings in the past.  She wears compression shorts because this make her thighs feel better.  She denise non healing wounds and symptoms of claudication.  She walks for exercise.  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    Past Surgical History:  Procedure Laterality Date   ABDOMINAL EXPOSURE N/A 06/09/2020   Procedure: ABDOMINAL EXPOSURE;  Surgeon: Rosetta Posner, MD;  Location: [facility] OR;  Service: Vascular;  Laterality: N/A;   ABDOMINAL HYSTERECTOMY     ABDOMINAL SURGERY     ANTERIOR LUMBAR FUSION N/A 06/09/2020   Procedure: LUMBAR FOUR - LUMBAR FIVE , LUMBAR FIVE - SACRUM ONE  ANTERIOR LUMBAR INTERBODY FUSION WITH INSTRUMENTATION AND ALLOGRAFT;  Surgeon: Phylliss Bob, MD;  Location: [place];  Service: Orthopedics;  Laterality: N/A;   APPENDECTOMY     BACK SURGERY     bowel recontruction     1988   CHOLECYSTECTOMY      Social History   Socioeconomic History   Marital status: Widowed    Spouse name: Not on file   Number of children: Not on  file   Years of education: Not on file   Highest education level: Not on file  Occupational History   Not on file  Tobacco Use   Smoking status: Former    Years: 2.00    Types: Cigarettes, Cigars    Quit date: 2000    Years since quitting: 22.6   Smokeless tobacco: Never  Vaping Use   Vaping Use: Never used  Substance and Sexual Activity   Alcohol use: No   Drug use: No   Sexual activity: Yes    Birth control/protection: Surgical  Other Topics Concern   Not on file  Social History Narrative   Not on file   Social Determinants of Health   Financial Resource Strain: Not on file  Food Insecurity: Not on file  Transportation Needs: Not on file  Physical Activity: Not on file  Stress: Not on file  Social Connections: Not on file  Intimate Partner Violence: Not on file    Family History  Problem Relation Age of Onset   Kidney disease Mother    Arthritis Mother    Heart disease Father     Current Outpatient Medications on File Prior to Visit  Medication Sig Dispense Refill   acetaminophen (TYLENOL) 500 MG tablet  Take 500-1,000 mg by mouth every 6 (six) hours as needed for moderate pain or headache.     amitriptyline (ELAVIL) 25 MG tablet Take 75 mg by mouth at bedtime.     baclofen (LIORESAL) 10 MG tablet Take 10 mg by mouth 3 (three) times daily.     HYDROcodone-acetaminophen (NORCO) 7.5-325 MG tablet Take 1 tablet by mouth 4 (four) times daily.     HYSINGLA ER 60 MG T24A Take 60 mg by mouth daily.     lubiprostone (AMITIZA) 24 MCG capsule Take 24 mcg by mouth 2 (two) times daily.     metoprolol succinate (TOPROL-XL) 50 MG 24 hr tablet TAKE 1 TABLET(50 MG) BY MOUTH DAILY AFTER SUPPER WITH OR IMMEDIATELY FOLLOWING A MEAL 30 tablet 2   ondansetron (ZOFRAN) 4 MG tablet Take 4 mg by mouth every 6 (six) hours as needed.     promethazine (PHENERGAN) 25 MG tablet Take 25 mg by mouth in the morning, at noon, and at bedtime.     rosuvastatin (CRESTOR) 10 MG tablet Take 10 mg by  mouth daily.     sucralfate (CARAFATE) 1 g tablet Take 1 g by mouth 2 (two) times daily.     tiZANidine (ZANAFLEX) 2 MG tablet Take 2 mg by mouth 2 (two) times daily as needed.     VENTOLIN HFA 108 (90 Base) MCG/ACT inhaler Inhale 2 puffs into the lungs 4 (four) times daily as needed for shortness of breath.   2   No current facility-administered medications on file prior to visit.    Allergies as of 09/17/2020 - Review Complete 06/10/2020  Allergen Reaction Noted   Percocet [oxycodone-acetaminophen] Nausea And Vomiting 07/29/2011   Hydroxyzine hcl Itching 07/21/2011   Tramadol Nausea And Vomiting 07/21/2011     ROS:   General:  No weight loss, Fever, chills  HEENT: No recent headaches, no nasal bleeding, no visual changes, no sore throat  Neurologic: No dizziness, blackouts, seizures. No recent symptoms of stroke or mini- stroke. No recent episodes of slurred speech, or temporary blindness.  Cardiac: No recent episodes of chest pain/pressure, no shortness of breath at rest.  No shortness of breath with exertion.  Denies history of atrial fibrillation or irregular heartbeat  Vascular: No history of rest pain in feet.  No history of claudication.  No history of non-healing ulcer, No history of DVT   Pulmonary: No home oxygen, no productive cough, no hemoptysis,  No asthma or wheezing  Musculoskeletal:  '[ ]'$  Arthritis, [ x] Low back pain,  [ x] Joint pain  Hematologic:No history of hypercoagulable state.  No history of easy bleeding.  No history of anemia  Gastrointestinal: No hematochezia or melena,  No gastroesophageal reflux, no trouble swallowing  Urinary: '[ ]'$  chronic Kidney disease, '[ ]'$  on HD - '[ ]'$  MWF or '[ ]'$  TTHS, '[ ]'$  Burning with urination, '[ ]'$  Frequent urination, '[ ]'$  Difficulty urinating;   Skin: No rashes  Psychological: No history of anxiety,  No history of depression  Physical Examination  Vitals:   09/17/20 1356  BP: (!) 141/93  Pulse: 84  Resp: 20  Temp: (!)  97.5 F (36.4 C)  TempSrc: Temporal  SpO2: 98%  Weight: 135 lb 11.2 oz (61.6 kg)  Height: '5\' 3"'$  (1.6 m)    Body mass index is 24.04 kg/m.  General:  Alert and oriented, no acute distress HEENT: Normal Neck: No bruit or JVD Pulmonary: Clear to auscultation bilaterally Cardiac: Regular Rate and Rhythm without  murmur Abdomen: Soft, non-tender, non-distended, no mass, no scars Skin: No rash Extremity Pulses:  2+ radial, brachial, femoral, dorsalis pedis, posterior tibial pulses bilaterally Musculoskeletal: No deformity or edema  Neurologic: Upper and lower extremity motor 5/5 and symmetric  DATA:    Venous Reflux Times  +--------------+---------+------+-----------+------------+--------+  RIGHT         Reflux NoRefluxReflux TimeDiameter cmsComments                          Yes                                   +--------------+---------+------+-----------+------------+--------+  CFV                     yes   >1 second                       +--------------+---------+------+-----------+------------+--------+  FV mid        no                                              +--------------+---------+------+-----------+------------+--------+  Popliteal     no                                              +--------------+---------+------+-----------+------------+--------+  GSV at SFJ              yes    >500 ms      0.37              +--------------+---------+------+-----------+------------+--------+  GSV prox thighno                            0.32              +--------------+---------+------+-----------+------------+--------+  GSV mid thigh no                            0.23              +--------------+---------+------+-----------+------------+--------+  GSV dist thighno                            0.33              +--------------+---------+------+-----------+------------+--------+  GSV at knee   no                             0.23              +--------------+---------+------+-----------+------------+--------+  GSV prox calf no                            0.20              +--------------+---------+------+-----------+------------+--------+  GSV mid calf  no                            0.19              +--------------+---------+------+-----------+------------+--------+  GSV dist calf no                            0.15              +--------------+---------+------+-----------+------------+--------+  SSV Pop Fossa no                            0.41              +--------------+---------+------+-----------+------------+--------+  SSV prox calf no                            0.29              +--------------+---------+------+-----------+------------+--------+  SSV mid calf            yes    >500 ms      0.21              +--------------+---------+------+-----------+------------+--------+  AASV          no                            0.24              +--------------+---------+------+-----------+------------+--------+     Summary:  Right:  - No evidence of deep vein thrombosis seen in the right lower extremity,  from the common femoral through the popliteal veins.  - No evidence of superficial venous reflux seen in the right greater  saphenous vein.  - Venous reflux is noted in the right common femoral vein.  - Venous reflux is noted in the right sapheno-femoral junction.  - Venous reflux is noted in the right short saphenous vein.     Assessment: Minimal venous reflux SFJ without GSV reflux No DVT She has B medial thigh pain where she shorts fat deposits.  Compartments are soft, palpable pedal pulses without symptoms of ischemia.     Plan: Continue exercise daily, compression shorts as needed.  Her pain may be related to her spine and it may dissipate with time.  She is not at risk of limb loss.  F/U PRN.  Roxy Horseman PA-C [facility] [place] Office: [phone]  MD in clinic

## 2020-10-20 ENCOUNTER — Other Ambulatory Visit: Payer: Self-pay | Admitting: Cardiology

## 2020-10-20 DIAGNOSIS — R000 Tachycardia, unspecified: Secondary | ICD-10-CM

## 2020-10-20 DIAGNOSIS — I10 Essential (primary) hypertension: Secondary | ICD-10-CM

## 2021-02-18 ENCOUNTER — Other Ambulatory Visit: Payer: Self-pay | Admitting: Internal Medicine

## 2021-02-19 LAB — COMPLETE METABOLIC PANEL WITH GFR
AG Ratio: 1.6 (calc) (ref 1.0–2.5)
ALT: 15 U/L (ref 6–29)
AST: 13 U/L (ref 10–35)
Albumin: 4.2 g/dL (ref 3.6–5.1)
Alkaline phosphatase (APISO): 101 U/L (ref 37–153)
BUN: 16 mg/dL (ref 7–25)
CO2: 23 mmol/L (ref 20–32)
Calcium: 9.8 mg/dL (ref 8.6–10.4)
Chloride: 108 mmol/L (ref 98–110)
Creat: 1.01 mg/dL (ref 0.50–1.03)
Globulin: 2.7 g/dL (calc) (ref 1.9–3.7)
Glucose, Bld: 93 mg/dL (ref 65–99)
Potassium: 3.8 mmol/L (ref 3.5–5.3)
Sodium: 142 mmol/L (ref 135–146)
Total Bilirubin: 0.3 mg/dL (ref 0.2–1.2)
Total Protein: 6.9 g/dL (ref 6.1–8.1)
eGFR: 64 mL/min/{1.73_m2} (ref >=60)

## 2021-02-19 LAB — CBC
HCT: 39.7 % (ref 35.0–45.0)
Hemoglobin: 13.4 g/dL (ref 11.7–15.5)
MCH: 28.9 pg (ref 27.0–33.0)
MCHC: 33.8 g/dL (ref 32.0–36.0)
MCV: 85.7 fL (ref 80.0–100.0)
MPV: 10.1 fL (ref 7.5–12.5)
Platelets: 263 10*3/uL (ref 140–400)
RBC: 4.63 10*6/uL (ref 3.80–5.10)
RDW: 14.2 % (ref 11.0–15.0)
WBC: 8.5 10*3/uL (ref 3.8–10.8)

## 2021-02-19 LAB — LIPID PANEL
Cholesterol: 207 mg/dL — ABNORMAL HIGH (ref <200)
HDL: 59 mg/dL (ref >=50)
LDL Cholesterol (Calc): 125 mg/dL (calc) — ABNORMAL HIGH
Non-HDL Cholesterol (Calc): 148 mg/dL (calc) — ABNORMAL HIGH (ref <130)
Total CHOL/HDL Ratio: 3.5 (calc) (ref <5.0)
Triglycerides: 124 mg/dL (ref <150)

## 2021-02-19 LAB — TSH: TSH: 0.69 mIU/L (ref 0.40–4.50)

## 2021-02-19 LAB — VITAMIN D 25 HYDROXY (VIT D DEFICIENCY, FRACTURES): Vit D, 25-Hydroxy: 15 ng/mL — ABNORMAL LOW (ref 30–100)

## 2021-04-13 ENCOUNTER — Other Ambulatory Visit: Payer: Self-pay | Admitting: Nurse Practitioner

## 2021-04-13 DIAGNOSIS — M4727 Other spondylosis with radiculopathy, lumbosacral region: Secondary | ICD-10-CM

## 2021-05-02 ENCOUNTER — Ambulatory Visit
Admission: RE | Admit: 2021-05-02 | Discharge: 2021-05-02 | Disposition: A | Discharge status: Home / Self Care | Payer: Medicare Other | Source: Ambulatory Visit | Attending: Nurse Practitioner | Admitting: Nurse Practitioner

## 2021-05-02 DIAGNOSIS — M4727 Other spondylosis with radiculopathy, lumbosacral region: Secondary | ICD-10-CM

## 2021-06-08 ENCOUNTER — Encounter: Payer: Self-pay | Admitting: Obstetrics & Gynecology

## 2021-06-08 ENCOUNTER — Ambulatory Visit: Payer: Medicare Other | Admitting: Obstetrics & Gynecology

## 2021-06-08 VITALS — BP 175/98 | HR 78 | Ht 63.0 in | Wt 147.0 lb

## 2021-06-08 DIAGNOSIS — Z01419 Encounter for gynecological examination (general) (routine) without abnormal findings: Secondary | ICD-10-CM | POA: Diagnosis not present

## 2021-06-08 DIAGNOSIS — Z90710 Acquired absence of both cervix and uterus: Secondary | ICD-10-CM | POA: Diagnosis not present

## 2021-06-08 NOTE — Progress Notes (Signed)
Patient ID: Katrina Vasquez, female   DOB: [birth year], 61 y.o.   MRN: [id number] ? ?Chief Complaint  ?Patient presents with  ? Gynecologic Exam  ?  NEW GYN  ? New Patient (Initial Visit)  ? ? ?HPI ?Katrina Vasquez is a 61 y.o. female.  No obstetric history on file. ?G2P1A1, referred for Gyn exam by Dr. Jeanie Cooks. She states she had cervical cancer 2010 and hysterectomy was done in [place], we have no records.Paps have all been normal since then. She is due a mammogram. ?HPI ? ?Past Medical History:  ?Diagnosis Date  ? Arthritis   ? back  ? Back pain   ? Cancer [facility])   ? cervical in remission  ? Carpal tunnel syndrome   ? Chronic pain   ? Dyspnea   ? GERD (gastroesophageal reflux disease)   ? Headache   ? History of kidney stones   ? Hypercholesteremia   ? Hypertension   ? Leg pain   ? Bilateral  ? Pneumonia   ? 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 ? ?Past Surgical History:  ?Procedure Laterality Date  ? ABDOMINAL EXPOSURE N/A 06/09/2020  ? Procedure: ABDOMINAL EXPOSURE;  Surgeon: Rosetta Posner, MD;  Location: [facility] OR;  Service: Vascular;  Laterality: N/A;  ? ABDOMINAL HYSTERECTOMY    ? ABDOMINAL SURGERY    ? ANTERIOR LUMBAR FUSION N/A 06/09/2020  ? Procedure: LUMBAR FOUR - LUMBAR FIVE , LUMBAR FIVE - SACRUM ONE  ANTERIOR LUMBAR INTERBODY FUSION WITH INSTRUMENTATION AND ALLOGRAFT;  Surgeon: Phylliss Bob, MD;  Location: [place];  Service: Orthopedics;  Laterality: N/A;  ? APPENDECTOMY    ? BACK SURGERY    ? bowel recontruction    ? 1988  ? CHOLECYSTECTOMY    ? ? ?Family History  ?Problem Relation Age of Onset  ? Kidney disease Mother   ? Arthritis Mother   ? Heart disease Father   ? ? ?Social History ?Social History  ? ?Tobacco Use  ? Smoking status: Former  ?  Years: 2.00  ?  Types: Cigarettes, Cigars  ?  Quit date: 2000  ?  Years since quitting: 23.3  ? Smokeless tobacco: Never  ?Vaping Use  ? Vaping Use: Never used  ?Substance Use Topics  ? Alcohol use: No  ? Drug use: No  ? ? ?Allergies  ?Allergen  Reactions  ? Percocet [Oxycodone-Acetaminophen] Nausea And Vomiting  ? Hydroxyzine Hcl Itching  ? Tramadol Nausea And Vomiting  ? ? ?Current Outpatient Medications  ?Medication Sig Dispense Refill  ? acetaminophen (TYLENOL) 500 MG tablet Take 500-1,000 mg by mouth every 6 (six) hours as needed for moderate pain or headache.    ? amitriptyline (ELAVIL) 25 MG tablet Take 75 mg by mouth at bedtime.    ? baclofen (LIORESAL) 10 MG tablet Take 10 mg by mouth 3 (three) times daily.    ? HYDROcodone-acetaminophen (NORCO) 7.5-325 MG tablet Take 1 tablet by mouth 4 (four) times daily.    ? HYSINGLA ER 60 MG T24A Take 60 mg by mouth daily.    ? lisinopril (ZESTRIL) 20 MG tablet Take 20 mg by mouth daily.    ? metoprolol succinate (TOPROL-XL) 50 MG 24 hr tablet TAKE 1 TABLET(50 MG) BY MOUTH DAILY AFTER SUPPER WITH OR IMMEDIATELY FOLLOWING A MEAL 90 tablet 3  ? promethazine (PHENERGAN) 25 MG tablet Take 25 mg by mouth in the morning, at noon, and at bedtime.    ? rosuvastatin (CRESTOR) 10 MG  tablet Take 10 mg by mouth daily.    ? sucralfate (CARAFATE) 1 g tablet Take 1 g by mouth 2 (two) times daily.    ? tiZANidine (ZANAFLEX) 2 MG tablet Take 2 mg by mouth 2 (two) times daily as needed.    ? VENTOLIN HFA 108 (90 Base) MCG/ACT inhaler Inhale 2 puffs into the lungs 4 (four) times daily as needed for shortness of breath.   2  ? lubiprostone (AMITIZA) 24 MCG capsule Take 24 mcg by mouth 2 (two) times daily. (Patient not taking: Reported on 06/08/2021)    ? ?No current facility-administered medications for this visit.  ? ? ?Review of Systems ?Review of Systems  ?Constitutional: Negative.   ?HENT: Negative.    ?Respiratory: Negative.    ?Cardiovascular: Negative.   ?Gastrointestinal:  Positive for constipation.  ?Genitourinary: Negative.   ? ?Blood pressure (!) 175/98, pulse 78, height '5\' 3"'$  (1.6 m), weight 147 lb (66.7 kg). ? ?Physical Exam ?Physical Exam ?Vitals and nursing note reviewed. Exam conducted with a chaperone present.   ?Constitutional:   ?   Appearance: Normal appearance.  ?HENT:  ?   Head: Normocephalic and atraumatic.  ?Cardiovascular:  ?   Rate and Rhythm: Normal rate.  ?Pulmonary:  ?   Effort: Pulmonary effort is normal.  ?   Breath sounds: Normal breath sounds.  ?Chest:  ?Breasts: ?   Right: Normal.  ?   Left: Normal.  ?Abdominal:  ?   General: Abdomen is flat. There is no distension.  ?   Palpations: Abdomen is soft. There is no mass.  ?   Comments: Vertical and low transverse surgical scars  ?Genitourinary: ?   General: Normal vulva.  ?   Vagina: Normal.  ?   Uterus: Absent.   ?   Adnexa: Right adnexa normal and left adnexa normal.  ?   Comments: Cuff no lesions ?Musculoskeletal:  ?   Cervical back: Normal range of motion and neck supple.  ?Neurological:  ?   Mental Status: She is alert.  ? ? ?Data Reviewed ?Pap normal 2016 ? ?Assessment ?Well woman exam with routine gynecological exam - Plan: MM 3D SCREEN BREAST BILATERAL ? ?S/P hysterectomy ?Diagnosis prior to hysterectomy needs to be documented but she should not need to continue pap tests ? ?Plan ?Records of her prior surgeries will be requested ?Orders Placed This Encounter  ?Procedures  ? MM 3D SCREEN BREAST BILATERAL  ?  Standing Status:   Future  ?  Standing Expiration Date:   10/09/2021  ?  Order Specific Question:   Reason for Exam (SYMPTOM  OR DIAGNOSIS REQUIRED)  ?  Answer:   screen  ?  Order Specific Question:   Is the patient pregnant?  ?  Answer:   No  ?  Order Specific Question:   Preferred imaging location?  ?  Answer:   [facility]  ? ? ? ? ?Emeterio Reeve ?06/08/2021, 9:49 AM ? ? ? ?

## 2022-04-12 ENCOUNTER — Emergency Department (HOSPITAL_COMMUNITY): Payer: Medicare Other

## 2022-04-12 ENCOUNTER — Other Ambulatory Visit: Payer: Self-pay

## 2022-04-12 ENCOUNTER — Emergency Department (HOSPITAL_COMMUNITY)
Admission: EM | Admit: 2022-04-12 | Discharge: 2022-04-12 | Disposition: A | Discharge status: Home / Self Care | Payer: Medicare Other | Attending: Emergency Medicine | Admitting: Emergency Medicine

## 2022-04-12 ENCOUNTER — Encounter (HOSPITAL_COMMUNITY): Payer: Self-pay

## 2022-04-12 DIAGNOSIS — I10 Essential (primary) hypertension: Secondary | ICD-10-CM | POA: Insufficient documentation

## 2022-04-12 DIAGNOSIS — R079 Chest pain, unspecified: Secondary | ICD-10-CM | POA: Diagnosis not present

## 2022-04-12 DIAGNOSIS — R0602 Shortness of breath: Secondary | ICD-10-CM | POA: Insufficient documentation

## 2022-04-12 LAB — BASIC METABOLIC PANEL
Anion gap: 11 (ref 5–15)
BUN: 7 mg/dL (ref 6–20)
CO2: 24 mmol/L (ref 22–32)
Calcium: 9.1 mg/dL (ref 8.9–10.3)
Chloride: 102 mmol/L (ref 98–111)
Creatinine, Ser: 0.86 mg/dL (ref 0.44–1.00)
GFR, Estimated: >60 mL/min (ref >60)
Glucose, Bld: 111 mg/dL — ABNORMAL HIGH (ref 70–99)
Potassium: 3.6 mmol/L (ref 3.5–5.1)
Sodium: 137 mmol/L (ref 135–145)

## 2022-04-12 LAB — LIPASE, BLOOD: Lipase: 23 U/L (ref 11–51)

## 2022-04-12 LAB — CBC WITH DIFFERENTIAL/PLATELET
Abs Immature Granulocytes: 0.01 10*3/uL (ref 0.00–0.07)
Basophils Absolute: 0 10*3/uL (ref 0.0–0.1)
Basophils Relative: 1 %
Eosinophils Absolute: 0.2 10*3/uL (ref 0.0–0.5)
Eosinophils Relative: 3 %
HCT: 42.1 % (ref 36.0–46.0)
Hemoglobin: 13.5 g/dL (ref 12.0–15.0)
Immature Granulocytes: 0 %
Lymphocytes Relative: 46 %
Lymphs Abs: 3.5 10*3/uL (ref 0.7–4.0)
MCH: 28.2 pg (ref 26.0–34.0)
MCHC: 32.1 g/dL (ref 30.0–36.0)
MCV: 87.9 fL (ref 80.0–100.0)
Monocytes Absolute: 0.5 10*3/uL (ref 0.1–1.0)
Monocytes Relative: 7 %
Neutro Abs: 3.2 10*3/uL (ref 1.7–7.7)
Neutrophils Relative %: 43 %
Platelets: 255 10*3/uL (ref 150–400)
RBC: 4.79 MIL/uL (ref 3.87–5.11)
RDW: 13.2 % (ref 11.5–15.5)
WBC: 7.5 10*3/uL (ref 4.0–10.5)
nRBC: 0 % (ref 0.0–0.2)

## 2022-04-12 LAB — D-DIMER, QUANTITATIVE: D-Dimer, Quant: 0.87 ug/mL-FEU — ABNORMAL HIGH (ref 0.00–0.50)

## 2022-04-12 LAB — BRAIN NATRIURETIC PEPTIDE: B Natriuretic Peptide: 107.5 pg/mL — ABNORMAL HIGH (ref 0.0–100.0)

## 2022-04-12 LAB — TROPONIN I (HIGH SENSITIVITY): Troponin I (High Sensitivity): 3 ng/L (ref <18)

## 2022-04-12 MED ORDER — PANTOPRAZOLE SODIUM 20 MG PO TBEC: 20.0000 mg | DELAYED_RELEASE_TABLET | Freq: Two times a day (BID) | ORAL | 0 refills | Status: DC

## 2022-04-12 MED ORDER — ALUM & MAG HYDROXIDE-SIMETH 200-200-20 MG/5ML PO SUSP
30.0000 mL | Freq: Once | ORAL | Status: AC
  Administered 2022-04-12: 30 mL via ORAL
  Filled 2022-04-12: qty 30

## 2022-04-12 MED ORDER — ACETAMINOPHEN 500 MG PO TABS
1000.0000 mg | ORAL_TABLET | Freq: Once | ORAL | Status: AC
  Administered 2022-04-12: 1000 mg via ORAL
  Filled 2022-04-12: qty 2

## 2022-04-12 MED ORDER — IOHEXOL 350 MG/ML SOLN
75.0000 mL | Freq: Once | INTRAVENOUS | Status: AC | PRN
  Administered 2022-04-12: 75 mL via INTRAVENOUS

## 2022-04-12 MED ORDER — MAALOX MAX 400-400-40 MG/5ML PO SUSP: 30.0000 mL | Freq: Three times a day (TID) | ORAL | 0 refills | Status: AC | PRN

## 2022-04-12 MED ORDER — FAMOTIDINE 20 MG PO TABS
20.0000 mg | ORAL_TABLET | Freq: Once | ORAL | Status: AC
  Administered 2022-04-12: 20 mg via ORAL
  Filled 2022-04-12: qty 1

## 2022-04-12 NOTE — ED Triage Notes (Signed)
Patient reports substernal non radiating chest pain with SOB onset 2 days ago , no emesis or diaphoresis . Denies cough or fever.

## 2022-04-12 NOTE — ED Provider Notes (Signed)
[place] Provider Note   CSN: [id number] Arrival date & time: 04/12/22  [id number]     History Chief Complaint  Patient presents with   Chest Pain    HPI Katrina Vasquez is a 62 y.o. female presenting for chief complaint of chest pain.  She is a 62 year old female with an extensive medical history.  States that starting 3 days ago after laying down after lunch she started having burning chest pain has been intermittent in nature.  Every time she tries to eat it gets worse.  Endorses a severe history gastroesophageal reflux disease but normally it resolves within a few hours.  Has been persistent over this past 2 days.  Denies any worsening today.  Denies fevers chills nausea vomiting syncope shortness of breath.  Otherwise ambulatory tolerating p.o. intake.  No history of similar pain Family history of heart disease no known before 87.  No personal history of chest pain or CAD.  Patient provides no other history besides a history of pneumonia.  She denies any history of DVTs or anticoagulation.  Denies worsening pain with inspiration.  Patient's recorded medical, surgical, social, medication list and allergies were reviewed in the Snapshot window as part of the initial history.   Review of Systems   Review of Systems  Constitutional:  Negative for chills and fever.  HENT:  Negative for ear pain and sore throat.   Eyes:  Negative for pain and visual disturbance.  Respiratory:  Negative for cough and shortness of breath.   Cardiovascular:  Positive for chest pain. Negative for palpitations.  Gastrointestinal:  Negative for abdominal pain and vomiting.  Genitourinary:  Negative for dysuria and hematuria.  Musculoskeletal:  Negative for arthralgias and back pain.  Skin:  Negative for color change and rash.  Neurological:  Negative for seizures and syncope.  All other systems reviewed and are negative.   Physical Exam Updated Vital Signs BP  (!) 155/95   Pulse 75   Temp 97.8 F (36.6 C) (Oral)   Resp 16   Ht 5\' 3"  (1.6 m)   Wt 67.6 kg   SpO2 100%   BMI 26.39 kg/m  Physical Exam Vitals and nursing note reviewed.  Constitutional:      General: She is not in acute distress.    Appearance: She is well-developed.  HENT:     Head: Normocephalic and atraumatic.  Eyes:     Conjunctiva/sclera: Conjunctivae normal.  Cardiovascular:     Rate and Rhythm: Normal rate and regular rhythm.     Heart sounds: No murmur heard. Pulmonary:     Effort: Pulmonary effort is normal. No respiratory distress.     Breath sounds: Normal breath sounds.  Abdominal:     General: There is no distension.     Palpations: Abdomen is soft.     Tenderness: There is no abdominal tenderness. There is no right CVA tenderness or left CVA tenderness.  Musculoskeletal:        General: No swelling or tenderness. Normal range of motion.     Cervical back: Neck supple.  Skin:    General: Skin is warm and dry.  Neurological:     General: No focal deficit present.     Mental Status: She is alert and oriented to person, place, and time. Mental status is at baseline.     Cranial Nerves: No cranial nerve deficit.      ED Course/ Medical Decision Making/ A&P Clinical Course as  of 04/12/22 0949  Wed Apr 12, 2022  0928 Studies negative. Reassess [CC]    Clinical Course User Index [CC] Tretha Sciara, MD    Procedures Procedures   Medications Ordered in ED Medications  alum & mag hydroxide-simeth (MAALOX/MYLANTA) 200-200-20 MG/5ML suspension 30 mL (30 mLs Oral Given 04/12/22 0740)  famotidine (PEPCID) tablet 20 mg (20 mg Oral Given 04/12/22 0740)  acetaminophen (TYLENOL) tablet 1,000 mg (1,000 mg Oral Given 04/12/22 0740)  iohexol (OMNIPAQUE) 350 MG/ML injection 75 mL (75 mLs Intravenous Contrast Given 04/12/22 0902)   Medical Decision Making: Katrina Vasquez is a 62 y.o. female who presented to the ED today with chest pain, detailed above.  Based  on patient's comorbidities, patient has a heart score of 3.    Patient's presentation is complicated by their history of multiple comorbid medical problems including hypertension hyperlipidemia.  Patient placed on continuous vitals and telemetry monitoring while in ED which was reviewed periodically.  Complete initial physical exam performed, notably the patient was hemodynamically stable in no acute distress.     Reviewed and confirmed nursing documentation for past medical history, family history, social history.    Initial Assessment: With the patient's presentation of left-sided chest pain, most likely diagnosis is musculoskeletal chest pain versus GERD, although ACS remains on the differential. Other diagnoses were considered including (but not limited to) pulmonary embolism, community-acquired pneumonia, aortic dissection, pneumothorax, underlying bony abnormality, anemia. These are considered less likely due to history of present illness and physical exam findings.    In particular, concerning pulmonary embolism: they deny malignancy, recent surgery, history of DVT, or calf tenderness leading to a low risk Wells score. Aortic Dissection also reconsidered but seems less likely based on the location, quality, onset, and severity of symptoms in this case. Patient has a lack of serious comorbidities for this condition including a lack of Smoking. Patient also has a lack of underlying history of AD or TAA.  This is most consistent with an acute life/limb threatening illness complicated by underlying chronic conditions.   Initial Plan: Evaluate for ACS with single troponin (given duration of symptoms) and EKG evaluated as below  Evaluate for dissection, bony abnormality, or pneumonia with chest x-ray and screening laboratory evaluation including CBC, BMP  Further evaluation for pulmonary embolism indicated at this time based on patient's Wells score and presenting history with history of Renal vein  thrombosis. Will collect Ddimer with plan to escalate to PE CTA if Ddimer positive. BNP ordered to evaluate for underlying risk of heart disease such as heart failure Further evaluation for Thoracic Aortic Dissection not indicated at this time based on patient's clinical history and PE findings.   Initial Study Results: EKG was reviewed independently. Rate, rhythm, axis, intervals all examined and without medically relevant abnormality. ST segments without concerns for elevations.    Laboratory  Single troponin demonstrated no acute abnormality.  D-dimer elevated therefore proceeding to CTA as above  CBC and BMP without obvious metabolic or inflammatory abnormalities requiring further evaluation   Radiology  CT Angio Chest Pulmonary Embolism (PE) W or WO Contrast  Result Date: 04/12/2022 CLINICAL DATA:  Elevated D-dimer level and chest pain EXAM: CT ANGIOGRAPHY CHEST WITH CONTRAST TECHNIQUE: Multidetector CT imaging of the chest was performed using the standard protocol during bolus administration of intravenous contrast. Multiplanar CT image reconstructions and MIPs were obtained to evaluate the vascular anatomy. RADIATION DOSE REDUCTION: This exam was performed according to the departmental dose-optimization program which includes automated exposure control,  adjustment of the mA and/or kV according to patient size and/or use of iterative reconstruction technique. CONTRAST:  54mL OMNIPAQUE IOHEXOL 350 MG/ML SOLN COMPARISON:  Radiographs 04/12/2022 and report from CT chest 05/28/2002 FINDINGS: Cardiovascular: No filling defect is identified in the pulmonary arterial tree to suggest pulmonary embolus. No cardiomegaly. Mediastinum/Nodes: Unremarkable Lungs/Pleura: Linear subsegmental atelectasis or scarring in the right lower lobe, 67 series 6. Similar mild linear scarring or atelectasis in the lingula, image 91 series 6. Upper Abdomen: Unremarkable Musculoskeletal: Lower cervical plate and screw  fixator. Mild thoracic spondylosis. Review of the MIP images confirms the above findings. IMPRESSION: 1. No filling defect is identified in the pulmonary arterial tree to suggest pulmonary embolus. 2. Linear subsegmental atelectasis or scarring in the right lower lobe and lingula. 3. Mild thoracic spondylosis. Electronically Signed   By: Van Clines M.D.   On: 04/12/2022 09:18   DG Chest Portable 1 View  Result Date: 04/12/2022 CLINICAL DATA:  PT c/o of chest pain x 2 days EXAM: PORTABLE CHEST - 1 VIEW COMPARISON:  10/25/2012 FINDINGS: Relatively low lung volumes with some crowding of bibasilar bronchovascular structures. No confluent airspace disease or overt edema. Heart size and mediastinal contours are within normal limits. No effusion.  No pneumothorax. Cervical fixation hardware. IMPRESSION: No acute cardiopulmonary disease. Electronically Signed   By: Lucrezia Europe M.D.   On: 04/12/2022 07:39    Final Assessment and Plan: Objective evaluation grossly reassuring.  I reassessed after 3 and half hours in the emergency room and symptoms are grossly resolved.  She is denying any further fevers chills nausea vomiting shortness of breath.  All symptoms resolved after Pepcid and Maalox.  Likely gastroesophageal reflux in origin given resolution of symptoms.  Given low heart score, patient's chest pain can be evaluated in the outpatient setting with her PCP. Pulmonary embolism and vascular disease ruled out with reassuring PE study today. Strict return precautions reinforced regarding redemonstration of symptoms.  Will refer to gastroenterology given severity of gastroesophageal reflux presentation and start patient on Protonix twice daily as well as Maalox 3 times daily.   Disposition:  I have considered need for hospitalization, however, considering all of the above, I believe this patient is stable for discharge at this time.  Patient/family educated about specific return precautions for given  chief complaint and symptoms.  Patient/family educated about follow-up with PCP and Gastro.     Patient/family expressed understanding of return precautions and need for follow-up. Patient spoken to regarding all imaging and laboratory results and appropriate follow up for these results. All education provided in verbal form with additional information in written form. Time was allowed for answering of patient questions. Patient discharged.    Emergency Department Medication Summary:   Medications  alum & mag hydroxide-simeth (MAALOX/MYLANTA) 200-200-20 MG/5ML suspension 30 mL (30 mLs Oral Given 04/12/22 0740)  famotidine (PEPCID) tablet 20 mg (20 mg Oral Given 04/12/22 0740)  acetaminophen (TYLENOL) tablet 1,000 mg (1,000 mg Oral Given 04/12/22 0740)  iohexol (OMNIPAQUE) 350 MG/ML injection 75 mL (75 mLs Intravenous Contrast Given 04/12/22 0902)               Clinical Impression:  1. Chest pain, unspecified type      Discharge   Final Clinical Impression(s) / ED Diagnoses Final diagnoses:  Chest pain, unspecified type    Rx / DC Orders ED Discharge Orders          Ordered    alum & mag hydroxide-simeth (Artesian) [id number]  MG/5ML suspension  Every 8 hours PRN        04/12/22 0949    pantoprazole (PROTONIX) 20 MG tablet  2 times daily        04/12/22 0949    Ambulatory referral to Gastroenterology        04/12/22 [id number]              Tretha Sciara, MD 04/12/22 [phone]

## 2022-04-12 NOTE — Discharge Instructions (Addendum)
You are seen today for chest pain.  This seems more gastroesophageal rather than cardiac in nature based on her workup today.  Please start taking Protonix twice daily as well as Maalox 3 times daily and follow-up with your primary care provider within 48 hours and gastroenterology as referred.Katrina Vasquez

## 2022-12-04 ENCOUNTER — Other Ambulatory Visit: Payer: Self-pay

## 2022-12-04 ENCOUNTER — Emergency Department (HOSPITAL_COMMUNITY)
Admission: EM | Admit: 2022-12-04 | Discharge: 2022-12-05 | Disposition: A | Discharge status: Home / Self Care | Payer: Medicare Other | Attending: Emergency Medicine | Admitting: Emergency Medicine

## 2022-12-04 DIAGNOSIS — R1084 Generalized abdominal pain: Secondary | ICD-10-CM

## 2022-12-04 DIAGNOSIS — R109 Unspecified abdominal pain: Secondary | ICD-10-CM | POA: Diagnosis present

## 2022-12-04 LAB — COMPREHENSIVE METABOLIC PANEL
ALT: 27 U/L (ref 0–44)
AST: 24 U/L (ref 15–41)
Albumin: 3.8 g/dL (ref 3.5–5.0)
Alkaline Phosphatase: 77 U/L (ref 38–126)
Anion gap: 10 (ref 5–15)
BUN: 12 mg/dL (ref 8–23)
CO2: 20 mmol/L — ABNORMAL LOW (ref 22–32)
Calcium: 10.1 mg/dL (ref 8.9–10.3)
Chloride: 105 mmol/L (ref 98–111)
Creatinine, Ser: 1.01 mg/dL — ABNORMAL HIGH (ref 0.44–1.00)
GFR, Estimated: >60 mL/min (ref >60)
Glucose, Bld: 145 mg/dL — ABNORMAL HIGH (ref 70–99)
Potassium: 3.7 mmol/L (ref 3.5–5.1)
Sodium: 135 mmol/L (ref 135–145)
Total Bilirubin: 0.5 mg/dL (ref <1.2)
Total Protein: 7.6 g/dL (ref 6.5–8.1)

## 2022-12-04 LAB — CBC
HCT: 41.5 % (ref 36.0–46.0)
Hemoglobin: 13.2 g/dL (ref 12.0–15.0)
MCH: 27.4 pg (ref 26.0–34.0)
MCHC: 31.8 g/dL (ref 30.0–36.0)
MCV: 86.3 fL (ref 80.0–100.0)
Platelets: 257 10*3/uL (ref 150–400)
RBC: 4.81 MIL/uL (ref 3.87–5.11)
RDW: 13.8 % (ref 11.5–15.5)
WBC: 10.5 10*3/uL (ref 4.0–10.5)
nRBC: 0 % (ref 0.0–0.2)

## 2022-12-04 LAB — LIPASE, BLOOD: Lipase: 23 U/L (ref 11–51)

## 2022-12-04 MED ORDER — ALUM & MAG HYDROXIDE-SIMETH 200-200-20 MG/5ML PO SUSP
30.0000 mL | Freq: Once | ORAL | Status: AC
  Administered 2022-12-04: 30 mL via ORAL
  Filled 2022-12-04: qty 30

## 2022-12-04 MED ORDER — LIDOCAINE VISCOUS HCL 2 % MT SOLN
15.0000 mL | Freq: Once | OROMUCOSAL | Status: AC
  Administered 2022-12-04: 15 mL via ORAL
  Filled 2022-12-04: qty 15

## 2022-12-04 MED ORDER — MORPHINE SULFATE (PF) 4 MG/ML IV SOLN
4.0000 mg | Freq: Once | INTRAVENOUS | Status: AC
  Administered 2022-12-04: 4 mg via INTRAVENOUS
  Filled 2022-12-04: qty 1

## 2022-12-04 MED ORDER — ONDANSETRON HCL 4 MG/2ML IJ SOLN
4.0000 mg | Freq: Once | INTRAMUSCULAR | Status: AC
  Administered 2022-12-04: 4 mg via INTRAVENOUS
  Filled 2022-12-04: qty 2

## 2022-12-04 NOTE — ED Triage Notes (Signed)
Patient via EMS for eval of abdominal pain, n/v since noon today. Last BM this morning and was normal. Taking hydrocodone without relief.

## 2022-12-04 NOTE — ED Provider Triage Note (Signed)
Emergency Medicine Provider Triage Evaluation Note  Katrina Vasquez , a 62 y.o. female  was evaluated in triage.  Pt complains of epigastric pain.  Review of Systems  Positive: Burning, abdominal pain, nausea, vomiting, passing flatus Negative: Fever, chills, diarrhea,  Physical Exam  BP (!) 114/107 (BP Location: Left Arm)   Pulse (!) 110   Temp 98.5 F (36.9 C) (Oral)   Resp 16   SpO2 96%  Gen:   Awake, no distress  Resp:  Normal effort  MSK:   Moves extremities without difficulty  Other:  Patient is tachycardic in the 110s  Medical Decision Making  Medically screening exam initiated at 7:07 PM.  Appropriate orders placed.  Daveigh A Ocanas was informed that the remainder of the evaluation will be completed by another provider, this initial triage assessment does not replace that evaluation, and the importance of remaining in the ED until their evaluation is complete.  CMP, lipase, CBC, urinalysis ordered   Dolphus Jenny, PA-C 12/04/22 1908

## 2022-12-05 ENCOUNTER — Emergency Department (HOSPITAL_COMMUNITY): Payer: Medicare Other

## 2022-12-05 MED ORDER — PROCHLORPERAZINE EDISYLATE 10 MG/2ML IJ SOLN
10.0000 mg | Freq: Once | INTRAMUSCULAR | Status: AC
  Administered 2022-12-05: 10 mg via INTRAVENOUS
  Filled 2022-12-05: qty 2

## 2022-12-05 MED ORDER — IOPAMIDOL (ISOVUE-370) INJECTION 76%
75.0000 mL | Freq: Once | INTRAVENOUS | Status: AC | PRN
  Administered 2022-12-05: 75 mL via INTRAVENOUS

## 2022-12-05 MED ORDER — PANTOPRAZOLE SODIUM 20 MG PO TBEC: 20.0000 mg | DELAYED_RELEASE_TABLET | Freq: Every day | ORAL | 0 refills | Status: AC

## 2022-12-05 NOTE — ED Notes (Signed)
Patient transported to CT 

## 2022-12-05 NOTE — ED Provider Notes (Signed)
Physical Exam  BP 131/82   Pulse 96   Temp 98.3 F (36.8 C)   Resp 18   SpO2 97%   Physical Exam Vitals and nursing note reviewed.  Constitutional:      General: She is not in acute distress.    Appearance: Normal appearance. She is normal weight. She is not ill-appearing.  HENT:     Head: Normocephalic and atraumatic.  Pulmonary:     Effort: Pulmonary effort is normal. No respiratory distress.  Abdominal:     General: Abdomen is flat.     Tenderness: There is generalized abdominal tenderness (Mild). There is no guarding.  Musculoskeletal:        General: Normal range of motion.     Cervical back: Neck supple.  Skin:    General: Skin is warm and dry.  Neurological:     Mental Status: She is alert and oriented to person, place, and time.  Psychiatric:        Mood and Affect: Mood normal.        Behavior: Behavior normal.     Procedures  Procedures  ED Course / MDM   Clinical Course as of 12/05/22 0915  Tue Dec 05, 2022  1610 Reassess after CT, likely DC if normal and pain controlled [AS]    Clinical Course User Index [AS] Lula Olszewski Edsel Petrin, PA-C   Medical Decision Making Amount and/or Complexity of Data Reviewed Labs: ordered. Radiology: ordered.  Risk Prescription drug management.  Assumed care at shift change from previous provider.  Please see previous note for full HPI.  In short, 62 year old female presenting for evaluation of generalized abdominal pain.  Symptoms began yesterday afternoon.  Pain is described as a burning sensation that begins in the epigastric region and radiates down.  She does have a his significant abdominal surgical history.  She had a normal bowel movement yesterday.  Plan at the time to change is to reassess after CT abdomen pelvis.  If this is negative for acute abnormalities and patient's symptoms are improved, will be stable for discharge home.  9604-  1. No acute CT findings in the abdomen or pelvis.  2. 1.7 cm in length  linear nonmetallic structure in the distal  ascending colon. Correlate with any history of ingested foreign  body. Possible this could be a chicken bone or fish bone.  3. Low-lying cecum with slight dilatation of some of the subcecal  distal ileal segments, but no more than previously, and apparently  chronic since this was also present on the 2017 scan.  4. Constipation and diverticulosis.  5. 6 mm nonobstructive caliceal stone in the midpole right kidney.  6. Aortic atherosclerosis.  7. Osteopenia and mild upper plate anterior wedge compression  deformity of the T12 vertebral body not seen previously, age  indeterminate but grossly this appears to be chronic or at least  subacute.    Aortic Atherosclerosis (ICD10-I70.0).  CT abdomen pelvis largely unremarkable.  Some constipation appreciated.  On reevaluation, patient states that her symptoms have resolved.  She reports having a prescription for Carafate at home.  She states her symptoms began shortly after eating some greasy fried food.  Overall suspect GERD as a cause of her symptoms along with some constipation.  She was educated on home treatment for constipation including MiraLAX.  She was recent prescription for Carafate.  Patient is in agreement with plan.  CT abdomen pelvis does show likely incidental finding of 1.7 cm foreign body in the ascending  colon.  She states she does eat a lot of fish and chicken.  This may be a bone.  This appears to be buried in some fecal matter.  This is not where her pain is.  She was educated on signs and symptoms of perforation and given very strict return precautions.  She voices understanding.  Stable discharge.  At this time there does not appear to be any evidence of an acute emergency medical condition and the patient appears stable for discharge with appropriate outpatient follow up. Diagnosis was discussed with patient who verbalizes understanding of care plan and is agreeable to discharge. I have  discussed return precautions with patient who verbalizes understanding. Patient encouraged to follow-up with their PCP within 1 week. All questions answered.  Note: Portions of this report may have been transcribed using voice recognition software. Every effort was made to ensure accuracy; however, inadvertent computerized transcription errors may still be present.   Mora Bellman 12/05/22 0915    Gerhard Munch, MD 12/08/22 0900

## 2022-12-05 NOTE — ED Provider Notes (Signed)
[place] EMERGENCY DEPARTMENT AT [facility] Provider Note   CSN: [id number] Arrival date & time: 12/04/22  1830     History  Chief Complaint  Patient presents with   Abdominal Pain    Katrina Vasquez is a 62 y.o. female.  Patient presents to the emergency department complaining of abdominal pain which began at noon yesterday.  Patient states the pain initially came as a burning in the epigastric region and has moved downwards and now has severe 8 out of 10 abdominal pain throughout her abdomen.  Patient also endorses associated nausea and vomiting.  She states that yesterday morning she had a normal bowel movement.  She denies chest pain, shortness of breath, urinary symptoms, vaginal discharge, fever.  Past medical history significant for hypertension, cervical cancer in remission, GERD.  Surgical history significant for abdominal hysterectomy, cholecystectomy, appendectomy, bowel reconstruction   Abdominal Pain      Home Medications Prior to Admission medications   Medication Sig Start Date End Date Taking? Authorizing Provider  acetaminophen (TYLENOL) 500 MG tablet Take 500-1,000 mg by mouth every 6 (six) hours as needed for moderate pain or headache.    [provider]  alum & mag hydroxide-simeth (MAALOX MAX) 400-400-40 MG/5ML suspension Take 30 mLs by mouth every 8 (eight) hours as needed for indigestion. 04/12/22   Glyn Ade, MD  amitriptyline (ELAVIL) 25 MG tablet Take 75 mg by mouth at bedtime. 07/22/15   [provider]  baclofen (LIORESAL) 10 MG tablet Take 10 mg by mouth 3 (three) times daily.    [provider]  HYDROcodone-acetaminophen (NORCO) 7.5-325 MG tablet Take 1 tablet by mouth 4 (four) times daily. 05/02/20   [provider]  [facility] ER 60 MG T24A Take 60 mg by mouth daily. 01/06/20   [provider]  lisinopril (ZESTRIL) 20 MG tablet Take 20 mg by mouth daily.    [provider]   lubiprostone (AMITIZA) 24 MCG capsule Take 24 mcg by mouth 2 (two) times daily. Patient not taking: Reported on 06/08/2021 03/26/20   [provider]  metoprolol succinate (TOPROL-XL) 50 MG 24 hr tablet TAKE 1 TABLET(50 MG) BY MOUTH DAILY AFTER SUPPER WITH OR IMMEDIATELY FOLLOWING A MEAL 10/21/20   Yates Decamp, MD  pantoprazole (PROTONIX) 20 MG tablet Take 1 tablet (20 mg total) by mouth 2 (two) times daily for 14 days. 04/12/22 04/26/22  Glyn Ade, MD  promethazine (PHENERGAN) 25 MG tablet Take 25 mg by mouth in the morning, at noon, and at bedtime. 03/22/18   [provider]  rosuvastatin (CRESTOR) 10 MG tablet Take 10 mg by mouth daily.    [provider]  sucralfate (CARAFATE) 1 g tablet Take 1 g by mouth 2 (two) times daily. 02/20/20   [provider]  tiZANidine (ZANAFLEX) 2 MG tablet Take 2 mg by mouth 2 (two) times daily as needed. 09/01/20   [provider]  VENTOLIN HFA 108 (90 Base) MCG/ACT inhaler Inhale 2 puffs into the lungs 4 (four) times daily as needed for shortness of breath.  12/25/16   [provider]      Allergies    Percocet [oxycodone-acetaminophen], Hydroxyzine hcl, and Tramadol    Review of Systems   Review of Systems  Gastrointestinal:  Positive for abdominal pain.    Physical Exam Updated Vital Signs BP 133/88   Pulse 90   Temp 98.3 F (36.8 C)   Resp 16   SpO2 100%  Physical Exam Vitals  and nursing note reviewed.  Constitutional:      General: She is not in acute distress.    Appearance: She is well-developed.  HENT:     Head: Normocephalic and atraumatic.  Eyes:     Conjunctiva/sclera: Conjunctivae normal.  Cardiovascular:     Rate and Rhythm: Normal rate and regular rhythm.  Pulmonary:     Effort: Pulmonary effort is normal. No respiratory distress.     Breath sounds: Normal breath sounds.  Abdominal:     Palpations: Abdomen is soft.     Tenderness: There is generalized abdominal tenderness.   Musculoskeletal:        General: No swelling.     Cervical back: Neck supple.  Skin:    General: Skin is warm and dry.     Capillary Refill: Capillary refill takes less than 2 seconds.  Neurological:     Mental Status: She is alert.  Psychiatric:        Mood and Affect: Mood normal.     ED Results / Procedures / Treatments   Labs (all labs ordered are listed, but only abnormal results are displayed) Labs Reviewed  COMPREHENSIVE METABOLIC PANEL - Abnormal; Notable for the following components:      Result Value   CO2 20 (*)    Glucose, Bld 145 (*)    Creatinine, Ser 1.01 (*)    All other components within normal limits  LIPASE, BLOOD  CBC  URINALYSIS, ROUTINE W REFLEX MICROSCOPIC    EKG None  Radiology No results found.  Procedures Procedures    Medications Ordered in ED Medications  morphine (PF) 4 MG/ML injection 4 mg (4 mg Intravenous Given 12/04/22 1844)  ondansetron (ZOFRAN) injection 4 mg (4 mg Intravenous Given 12/04/22 1843)  alum & mag hydroxide-simeth (MAALOX/MYLANTA) 200-200-20 MG/5ML suspension 30 mL (30 mLs Oral Given 12/04/22 1919)    And  lidocaine (XYLOCAINE) 2 % viscous mouth solution 15 mL (15 mLs Oral Given 12/04/22 1919)  prochlorperazine (COMPAZINE) injection 10 mg (10 mg Intravenous Given 12/05/22 0605)  iopamidol (ISOVUE-370) 76 % injection 75 mL (75 mLs Intravenous Contrast Given 12/05/22 0558)    ED Course/ Medical Decision Making/ A&P                                 Medical Decision Making Amount and/or Complexity of Data Reviewed Labs: ordered. Radiology: ordered.  Risk Prescription drug management.   This patient presents to the ED for concern of abdominal pain, this involves an extensive number of treatment options, and is a complaint that carries with it a high risk of complications and morbidity.  The differential diagnosis includes SBO, gastritis, gastroenteritis, diverticulitis, others   Co morbidities that complicate  the patient evaluation  History of previous abdominal surgeries, GERD, diabetes   Additional history obtained:  Additional history obtained from EMS External records from outside source obtained and reviewed including notes from gastroenterology showing attempt to establish care but unable to reach patient by phone in April of this year   Lab Tests:  I Ordered, and personally interpreted labs.  The pertinent results include: Lipase 23, grossly unremarkable CBC, CMP   Imaging Studies ordered:  I ordered imaging studies including CT abdomen pelvis with contrast Imaging pending at shift end    Problem List / ED Course / Critical interventions / Medication management   I ordered medication including morphine, Zofran, Compazine for nausea, vomiting, abdominal pain Reevaluation  of the patient after these medicines showed that the patient improved I have reviewed the patients home medicines and have made adjustments as needed   Test / Admission - Considered:  Patient care transferred to [facility], PA-C at shift handoff.  Disposition pending results of abdominal CT scan and patient reassessment.         Final Clinical Impression(s) / ED Diagnoses Final diagnoses:  Generalized abdominal pain    Rx / DC Orders ED Discharge Orders     None         Pamala Duffel 12/05/22 7846    Palumbo, April, MD 12/05/22 [phone]

## 2022-12-05 NOTE — Discharge Instructions (Signed)
You have been seen today for your complaint of abdominal pain. Your lab work was reassuring. Your imaging was reassuring.  Does show some constipation.  Also does show a potential fish or chicken bone in your colon.  This is likely an incidental finding.  Return to the emergency department with any new or worsening symptoms. Your discharge medications include Protonix.  Take this once daily in the morning. Follow up with: GI.  I have included a phone number for you to call.  Schedule an appointment. Please seek immediate medical care if you develop any of the following symptoms: You have a fever, and your symptoms suddenly get worse. You leak poop or have blood in your poop. Your belly feels hard or bigger than normal (bloated). You have very bad belly pain. You feel dizzy or you faint. At this time there does not appear to be the presence of an emergent medical condition, however there is always the potential for conditions to change. Please read and follow the below instructions.  Do not take your medicine if  develop an itchy rash, swelling in your mouth or lips, or difficulty breathing; call 911 and seek immediate emergency medical attention if this occurs.  You may review your lab tests and imaging results in their entirety on your MyChart account.  Please discuss all results of fully with your primary care provider and other specialist at your follow-up visit.  Note: Portions of this text may have been transcribed using voice recognition software. Every effort was made to ensure accuracy; however, inadvertent computerized transcription errors may still be present.
